# Patient Record
Sex: Female | Born: 1975 | Race: Black or African American | Hispanic: No | Marital: Single | Smoking: Former smoker
Health system: Southern US, Community
[De-identification: ages and names within clinical notes are randomized; demographics above are authoritative.]

## PROBLEM LIST (undated history)

## (undated) ENCOUNTER — Emergency Department (HOSPITAL_COMMUNITY): Admission: EM | Discharge status: Absent without leave | Payer: Medicare PPO

## (undated) DIAGNOSIS — R202 Paresthesia of skin: Secondary | ICD-10-CM

## (undated) DIAGNOSIS — M47816 Spondylosis without myelopathy or radiculopathy, lumbar region: Secondary | ICD-10-CM

## (undated) DIAGNOSIS — F32A Depression, unspecified: Secondary | ICD-10-CM

## (undated) DIAGNOSIS — D649 Anemia, unspecified: Secondary | ICD-10-CM

## (undated) DIAGNOSIS — I10 Essential (primary) hypertension: Secondary | ICD-10-CM

## (undated) DIAGNOSIS — G5603 Carpal tunnel syndrome, bilateral upper limbs: Secondary | ICD-10-CM

## (undated) DIAGNOSIS — M25562 Pain in left knee: Principal | ICD-10-CM

## (undated) HISTORY — PX: BREAST REDUCTION SURGERY: SHX8

## (undated) HISTORY — PX: HAND SURGERY: SHX662

## (undated) HISTORY — PX: TUBAL LIGATION: SHX77

## (undated) HISTORY — DX: Anemia, unspecified: D64.9

## (undated) HISTORY — DX: Depression, unspecified: F32.A

---

## 2002-10-03 HISTORY — PX: TUBAL LIGATION: SHX77

## 2012-10-03 HISTORY — PX: REDUCTION MAMMAPLASTY: SUR839

## 2013-10-03 HISTORY — PX: HAND SURGERY: SHX662

## 2019-09-30 ENCOUNTER — Ambulatory Visit (INDEPENDENT_AMBULATORY_CARE_PROVIDER_SITE_OTHER): Payer: Medicare PPO | Admitting: Family Medicine

## 2019-09-30 ENCOUNTER — Other Ambulatory Visit: Payer: Self-pay

## 2019-09-30 ENCOUNTER — Encounter: Payer: Self-pay | Admitting: Family Medicine

## 2019-09-30 VITALS — BP 128/84 | HR 87 | Ht 64.0 in | Wt 203.0 lb

## 2019-09-30 DIAGNOSIS — Z8659 Personal history of other mental and behavioral disorders: Secondary | ICD-10-CM | POA: Insufficient documentation

## 2019-09-30 DIAGNOSIS — M25511 Pain in right shoulder: Secondary | ICD-10-CM | POA: Diagnosis not present

## 2019-09-30 DIAGNOSIS — Z1231 Encounter for screening mammogram for malignant neoplasm of breast: Secondary | ICD-10-CM | POA: Diagnosis not present

## 2019-09-30 DIAGNOSIS — M5442 Lumbago with sciatica, left side: Secondary | ICD-10-CM | POA: Diagnosis not present

## 2019-09-30 DIAGNOSIS — G8929 Other chronic pain: Secondary | ICD-10-CM

## 2019-09-30 DIAGNOSIS — M25562 Pain in left knee: Secondary | ICD-10-CM | POA: Diagnosis not present

## 2019-09-30 DIAGNOSIS — M549 Dorsalgia, unspecified: Secondary | ICD-10-CM | POA: Insufficient documentation

## 2019-09-30 MED ORDER — TRAMADOL HCL 50 MG PO TABS: 50.0000 mg | ORAL_TABLET | Freq: Four times a day (QID) | ORAL | 0 refills | Status: DC | PRN

## 2019-09-30 MED ORDER — ZOLPIDEM TARTRATE 10 MG PO TABS: 10.0000 mg | ORAL_TABLET | Freq: Every evening | ORAL | 0 refills | Status: DC | PRN

## 2019-09-30 NOTE — Patient Instructions (Addendum)
It was great to see you!  Our plans for today:  - I'm refilling your tramadol and ambien but I would like to discuss alternatives to these the next time I see you in a week or two - I am going to refer you to physical therapy for your shoulder and knee pain - I would like to wait on your medical records before trying to place a referral for the epidural shots - I am ordering a mammogram for you today. - On our next visit we will discuss your depression and possible treatment options.  Take care and seek immediate care sooner if you develop any concerns.   Dr. [facility]

## 2019-09-30 NOTE — Assessment & Plan Note (Signed)
Assessment: Patient with 1 year history of left knee pain she states in the past she has received some injections for this.  Plan: -Referral to physical therapy given to patient -We will await patient's medical records before considering further knee injections -Follow-up with patient schedule in the next 1-2 weeks

## 2019-09-30 NOTE — Assessment & Plan Note (Signed)
Plan: -Referral to physical therapy provided to patient

## 2019-09-30 NOTE — Assessment & Plan Note (Addendum)
Assessment: Patient with low back pain he states she used to get regular epidurals via orthopedics.  Etiology likely musculoskeletal.  Patient without red flags suggestive of infection, malignancy, or fracture. Plan: -Awaiting patient's medical records prior to initiating a referral.  Patient plans to return in the next 1-2 weeks to further discuss this -Provided patient short-term refill of her tramadol but discussed with her that this is not an ideal long-term solution to her problem.  Patient is aware and understands.  PDMP reviewed

## 2019-09-30 NOTE — Assessment & Plan Note (Signed)
Assessment: PHQ-9 score of 13.  No suicidal homicidal ideation.  Patient states she has a history of feeling depressed but does not has any specific item to attribute this to at this time. Plan: -We will discuss this further at the next visit in the next 1-2 weeks -Can consider starting medication such as SSRI at this time once receive more the patient's medical records -We will plan for repeat PHQ-9 on the next visit -Return precautions given to patient
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
## 2019-10-04 DIAGNOSIS — M48062 Spinal stenosis, lumbar region with neurogenic claudication: Secondary | ICD-10-CM

## 2019-10-04 HISTORY — DX: Spinal stenosis, lumbar region with neurogenic claudication: M48.062

## 2019-10-12 ENCOUNTER — Emergency Department (HOSPITAL_COMMUNITY)
Admission: EM | Admit: 2019-10-12 | Discharge: 2019-10-12 | Disposition: A | Discharge status: Home / Self Care | Payer: Medicare PPO | Attending: Emergency Medicine | Admitting: Emergency Medicine

## 2019-10-12 ENCOUNTER — Other Ambulatory Visit: Payer: Self-pay

## 2019-10-12 ENCOUNTER — Encounter (HOSPITAL_COMMUNITY): Payer: Self-pay | Admitting: Emergency Medicine

## 2019-10-12 DIAGNOSIS — J069 Acute upper respiratory infection, unspecified: Secondary | ICD-10-CM

## 2019-10-12 DIAGNOSIS — Z87891 Personal history of nicotine dependence: Secondary | ICD-10-CM | POA: Diagnosis not present

## 2019-10-12 DIAGNOSIS — J029 Acute pharyngitis, unspecified: Secondary | ICD-10-CM | POA: Diagnosis present

## 2019-10-12 DIAGNOSIS — U071 COVID-19: Secondary | ICD-10-CM | POA: Diagnosis not present

## 2019-10-12 HISTORY — DX: COVID-19: U07.1

## 2019-10-12 NOTE — ED Provider Notes (Signed)
[facility] DEPT Provider Note   CSN: [id number] Arrival date & time: 10/12/19  1153     History Chief Complaint  Patient presents with  . Chills  . Sore Throat  . loss of smell    Sydney Cobb is a 44 y.o. female.  44 year old female presents with complaint of sore throat, chills, diarrhea, cough, body aches and loss of smell.  Patient ports onset of symptoms 1 week ago, states she traveled to [place], [place] and was exposed to family members who have also been sick.  Patient denies history of asthma or chronic lung conditions, no other complaints or concerns.  Sydney Cobb was evaluated in Emergency Department on 10/12/2019 for the symptoms described in the history of present illness. She was evaluated in the context of the global COVID-19 pandemic, which necessitated consideration that the patient might be at risk for infection with the SARS-CoV-2 virus that causes COVID-19. Institutional protocols and algorithms that pertain to the evaluation of patients at risk for COVID-19 are in a state of rapid change based on information released by regulatory bodies including the CDC and federal and state organizations. These policies and algorithms were followed during the patient's care in the ED.         No past medical history on file.  Patient Active Problem List   Diagnosis Date Noted  . Chronic right shoulder pain 09/30/2019  . Left knee pain 09/30/2019  . Back pain 09/30/2019  . History of depression 09/30/2019     OB History   No obstetric history on file.     No family history on file.  Social History   Tobacco Use  . Smoking status: Former Research scientist (life sciences)  . Smokeless tobacco: Never Used  Substance Use Topics  . Alcohol use: Never  . Drug use: Never    Home Medications Prior to Admission medications   Medication Sig Start Date End Date Taking? Authorizing Provider  traMADol (ULTRAM) 50 MG tablet Take 1 tablet (50 mg total)  by mouth every 6 (six) hours as needed. 09/30/19   Welborn, Ryan, DO  zolpidem (AMBIEN) 10 MG tablet Take 1 tablet (10 mg total) by mouth at bedtime as needed for up to 15 doses for sleep. 09/30/19   Lurline Del, DO    Allergies    Patient has no known allergies.  Review of Systems   Review of Systems  Constitutional: Positive for chills. Negative for fever.  HENT: Positive for congestion and sore throat.   Respiratory: Positive for cough and shortness of breath.   Gastrointestinal: Positive for diarrhea. Negative for abdominal pain.  Musculoskeletal: Positive for arthralgias and myalgias.  Skin: Negative for rash and wound.  Allergic/Immunologic: Negative for immunocompromised state.  Neurological: Negative for weakness.  Hematological: Negative for adenopathy.  Psychiatric/Behavioral: Negative for confusion.  All other systems reviewed and are negative.   Physical Exam Updated Vital Signs BP (!) 167/98 (BP Location: Right Arm)   Pulse (!) 107   Temp 98.2 F (36.8 C) (Oral)   Resp 18   Ht 5\' 4"  (1.626 m)   Wt 92.5 kg   LMP 09/22/2019 (Approximate)   SpO2 100%   BMI 35.02 kg/m   Physical Exam Vitals and nursing note reviewed.  Constitutional:      General: She is not in acute distress.    Appearance: She is well-developed. She is not diaphoretic.  HENT:     Head: Normocephalic and atraumatic.  Cardiovascular:     Rate and  Rhythm: Normal rate and regular rhythm.     Heart sounds: Normal heart sounds.  Pulmonary:     Effort: Pulmonary effort is normal.     Breath sounds: Normal breath sounds.  Abdominal:     Palpations: Abdomen is soft.  Musculoskeletal:     Cervical back: Neck supple.  Lymphadenopathy:     Cervical: No cervical adenopathy.  Skin:    General: Skin is warm and dry.     Findings: No erythema or rash.  Neurological:     Mental Status: She is alert and oriented to person, place, and time.  Psychiatric:        Behavior: Behavior normal.      ED Results / Procedures / Treatments   Labs (all labs ordered are listed, but only abnormal results are displayed) Labs Reviewed  NOVEL CORONAVIRUS, NAA (HOSP ORDER, SEND-OUT TO REF LAB; TAT 18-24 HRS)    EKG None  Radiology No results found.  Procedures Procedures (including critical care time)  Medications Ordered in ED Medications - No data to display  ED Course  I have reviewed the triage vital signs and the nursing notes.  Pertinent labs & imaging results that were available during my care of the patient were reviewed by me and considered in my medical decision making (see chart for details).  Clinical Course as of Oct 11 1552  Sat Oct 11, 3866  6848 44 year old female presents with symptoms concerning for COVID-19 infection.  Patient is well-appearing, vitals are reassuring.  Plan is to send out test, advised to quarantine and follow-up with her test results.   [LM]    Clinical Course User Index [LM] Roque Lias   MDM Rules/Calculators/A&P                     Final Clinical Impression(s) / ED Diagnoses Final diagnoses:  Viral upper respiratory tract infection    Rx / DC Orders ED Discharge Orders    None       Tacy Learn, PA-C 10/12/19 1554    Blanchie Dessert, MD 10/13/19 1645

## 2019-10-12 NOTE — Discharge Instructions (Addendum)
Home to quarantine, pending area test results. Your results should be available after 24 to 48 hours. Return to ER for severe or concerning symptoms otherwise follow-up with PCP.

## 2019-10-12 NOTE — ED Triage Notes (Signed)
Pt states that she started having sore throat, body aches, chills, loss of smell about 1 week ago. Alert and oriented. 100% RA

## 2019-10-14 ENCOUNTER — Ambulatory Visit: Payer: Medicare PPO | Admitting: Physical Therapy

## 2019-10-14 LAB — NOVEL CORONAVIRUS, NAA (HOSP ORDER, SEND-OUT TO REF LAB; TAT 18-24 HRS): SARS-CoV-2, NAA: DETECTED — AB

## 2019-10-31 ENCOUNTER — Other Ambulatory Visit: Payer: Self-pay | Admitting: Family Medicine

## 2019-11-01 ENCOUNTER — Telehealth: Payer: Self-pay

## 2019-11-01 ENCOUNTER — Other Ambulatory Visit: Payer: Self-pay | Admitting: Family Medicine

## 2019-11-01 DIAGNOSIS — G8929 Other chronic pain: Secondary | ICD-10-CM

## 2019-11-01 DIAGNOSIS — M5442 Lumbago with sciatica, left side: Secondary | ICD-10-CM

## 2019-11-01 MED ORDER — TRAMADOL HCL 50 MG PO TABS: 50.0000 mg | ORAL_TABLET | Freq: Four times a day (QID) | ORAL | 0 refills | Status: DC | PRN

## 2019-11-01 NOTE — Progress Notes (Signed)
Called patient and spoke to her about her refill request for more tramadol as she is trying to get in with an orthopedic doctor to work on her chronic back pain.  The patient had previoously gone to an orthopedist  for injections into her back due to chronic back pain prior to moving to [place], [place].  I informed the patient that I sent in a referral for her to see an orthopedist here in [place] and that would refill her tramadol for an additional month to provide her time to get him an established with an orthopedic physician I informed the patient that after this time may not be providing more refills of this medication.  I informed patient please let me know if she has not heard from an orthopedic physician in the next 2 weeks.  PDMP reviewed.

## 2019-11-01 NOTE — Telephone Encounter (Signed)
Pt calls nurse line in regards to refill request for tramadol.  Patient states that she is still experiencing pain and needs refill.   To PCP   Please advise Talbot Grumbling, RN

## 2019-11-13 ENCOUNTER — Ambulatory Visit: Payer: Medicare PPO | Admitting: Family Medicine

## 2019-11-15 ENCOUNTER — Ambulatory Visit: Payer: Medicare PPO

## 2019-11-22 ENCOUNTER — Ambulatory Visit: Payer: Medicare PPO | Admitting: Family Medicine

## 2019-11-28 ENCOUNTER — Ambulatory Visit: Payer: Medicare PPO | Admitting: Family Medicine

## 2019-12-04 ENCOUNTER — Telehealth (INDEPENDENT_AMBULATORY_CARE_PROVIDER_SITE_OTHER): Payer: Medicare PPO | Admitting: Family Medicine

## 2019-12-04 ENCOUNTER — Other Ambulatory Visit: Payer: Self-pay

## 2019-12-04 DIAGNOSIS — M5442 Lumbago with sciatica, left side: Secondary | ICD-10-CM | POA: Diagnosis not present

## 2019-12-04 DIAGNOSIS — Z1231 Encounter for screening mammogram for malignant neoplasm of breast: Secondary | ICD-10-CM | POA: Diagnosis not present

## 2019-12-04 DIAGNOSIS — Z1239 Encounter for other screening for malignant neoplasm of breast: Secondary | ICD-10-CM | POA: Diagnosis not present

## 2019-12-04 DIAGNOSIS — G8929 Other chronic pain: Secondary | ICD-10-CM

## 2019-12-04 MED ORDER — TRAMADOL HCL 50 MG PO TABS: 50.0000 mg | ORAL_TABLET | Freq: Four times a day (QID) | ORAL | 0 refills | Status: DC | PRN

## 2019-12-04 MED ORDER — ZOLPIDEM TARTRATE 10 MG PO TABS: 5.0000 mg | ORAL_TABLET | Freq: Every evening | ORAL | 1 refills | Status: DC | PRN

## 2019-12-04 NOTE — Assessment & Plan Note (Addendum)
Assessment: Chronic back pain-patient previously asked to get established with orthopedic surgery as she followed with them prior to moving to this area in a different location.  Patient apparently no showed to her orthopedic surgery appointment which she says is due to lack of transportation at that time which is since resolved. Plan: -Provided patient referral for physical therapy, orthopedic surgery. -Informed patient that I would provide her a few more days worth of her tramadol but after this I will provide her no further refills on this medication as she missed her previous physical therapy and orthopedic surgery appointments to get established with them.  Patient verbalized understanding that this would be the last prescription of tramadol I will be writing for her.

## 2019-12-04 NOTE — Progress Notes (Signed)
Virtual Visit via Telephone Note  I connected with Sydney Cobb on 12/04/19 at 10:05 AM EST by telephone and verified that I am speaking with the correct person using two identifiers.  Location: Patient: Home Provider: [facility]   I discussed the limitations, risks, security and privacy concerns of performing an evaluation and management service by telephone and the availability of in person appointments. I also discussed with the patient that there may be a patient responsible charge related to this service. The patient expressed understanding and agreed to proceed.   History of Present Illness: Requesting to switch from an in person appointment to telephone appointment due to oversleeping.  Patient states she scheduled this appointment because she had some transportation issues and because of that she missed her mammogram, her PT appointments, and her orthopedic appointment.  States her chronic back pain continues but she has been unable to get in with her physical therapist or orthopedic surgeon.  She states this transportation issues now resolved.  Per the referral note from the orthopedic appointment the patient did not call and canceled and simply no-show.  She also states that she wanted refills on her tramadol and Ambien.  I informed the patient that as per our last discussion I had stated that and may not refill it again when she gets established with the orthopedic surgeon.     Observations/Objective: Patient without signs of shortness of breath or difficulty speaking/breathing during the conversation via phone.  Assessment and Plan: After further discussion with the patient and reviewing the PDMP I decided it would be appropriate to give the patient a small additional one half tramadol to hold her over until she reestablishes with her orthopedic surgeon.  I informed the patient that this would be the last prescription I would be given for this and the patient is understanding that the goal  is to get established with orthopedic surgeon and continue her back injections that she was using prior to moving rather than using narcotics long-term for back pain. -Refills provided -Referral for mammogram provided -Referral for PT was provided -Patient plans to call her orthopedic surgeon and asked to reschedule, informed patient that if they require another referral I can certainly provide this. -Recommended patient schedule an in person appointment to further discuss her other medical complaints including history of depression. -PDMP reviewed  Follow Up Instructions:  Patient plans to call her orthopedic surgeon and reschedule her appointment.  If this is not possible patient plans to call back and I can provide another referral for that orthopedic surgeon. Patient plans to schedule follow-up appointment to discuss her other medical complaints including history of depression.  I discussed the assessment and treatment plan with the patient. The patient was provided an opportunity to ask questions and all were answered. The patient agreed with the plan and demonstrated an understanding of the instructions.   The patient was advised to call back or seek an in-person evaluation if the symptoms worsen or if the condition fails to improve as anticipated.  I provided 12 minutes of non-face-to-face time during this encounter.   Lurline Del, DO

## 2019-12-10 ENCOUNTER — Ambulatory Visit: Payer: Medicare PPO | Admitting: Family Medicine

## 2019-12-12 ENCOUNTER — Ambulatory Visit
Admission: RE | Admit: 2019-12-12 | Discharge: 2019-12-12 | Disposition: A | Discharge status: Home / Self Care | Payer: Medicare PPO | Source: Ambulatory Visit | Attending: Family Medicine | Admitting: Family Medicine

## 2019-12-12 ENCOUNTER — Other Ambulatory Visit: Payer: Self-pay

## 2019-12-12 DIAGNOSIS — Z1231 Encounter for screening mammogram for malignant neoplasm of breast: Secondary | ICD-10-CM

## 2019-12-17 ENCOUNTER — Ambulatory Visit: Payer: Medicare PPO | Admitting: Family Medicine

## 2019-12-24 ENCOUNTER — Ambulatory Visit: Payer: Medicare PPO

## 2019-12-25 ENCOUNTER — Other Ambulatory Visit: Payer: Self-pay

## 2019-12-25 ENCOUNTER — Ambulatory Visit: Payer: Medicare PPO | Attending: Family Medicine

## 2019-12-25 DIAGNOSIS — M5442 Lumbago with sciatica, left side: Secondary | ICD-10-CM | POA: Insufficient documentation

## 2019-12-25 DIAGNOSIS — M25552 Pain in left hip: Secondary | ICD-10-CM | POA: Diagnosis present

## 2019-12-25 DIAGNOSIS — G8929 Other chronic pain: Secondary | ICD-10-CM | POA: Diagnosis present

## 2019-12-25 DIAGNOSIS — R293 Abnormal posture: Secondary | ICD-10-CM | POA: Diagnosis present

## 2019-12-25 NOTE — Patient Instructions (Signed)
Access Code: [email or web address]: [email or web address]: 03/24/2021Prepared by: CynthiaExercises  Supine Lower Trunk Rotation - 2 x daily - 7 x weekly - 1 sets - 10 reps  Supine Figure 4 Piriformis Stretch - 1 x daily - 7 x weekly - 1 sets - 3 reps - 20 seconds hold  Supine Hamstring Stretch with Strap - 1 x daily - 7 x weekly - 1 sets - 3 reps - 20 seconds hold  Supine ITB Stretch with Strap - 1 x daily - 7 x weekly - 1 sets - 3 reps - 20 seconds hold
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
## 2020-01-01 ENCOUNTER — Telehealth: Payer: Self-pay | Admitting: Family Medicine

## 2020-01-01 NOTE — Telephone Encounter (Signed)
°   Sydney Cobb DOB: June 25, 1976 MRN: [id number]   RIDER WAIVER AND RELEASE OF LIABILITY  For purposes of improving physical access to our facilities, [facility] is pleased to partner with third parties to provide [facility] patients or other authorized individuals the option of convenient, on-demand ground transportation services (the [employer]) through use of the technology service that enables users to request on-demand ground transportation from independent third-party providers.  By opting to use and accept these [employer], I, the undersigned, hereby agree on behalf of myself, and on behalf of any minor child using the [employer] for whom I am the parent or legal guardian, as follows:  1. Government social research officer provided to me are provided by independent third-party transportation providers who are not [employer] or employees and who are unaffiliated with [employer]. 2. [facility] is neither a transportation carrier nor a common or public carrier. 3. [facility] has no control over the quality or safety of the transportation that occurs as a result of the [employer]. 4. [facility] cannot guarantee that any third-party transportation provider will complete any arranged transportation service. 5. [facility] makes no representation, warranty, or guarantee regarding the reliability, timeliness, quality, safety, suitability, or availability of any of the Transport Services or that they will be error free. 6. I fully understand that traveling by vehicle involves risks and dangers of serious bodily injury, including permanent disability, paralysis, and death. I agree, on behalf of myself and on behalf of any minor child using the Transport Services for whom I am the parent or legal guardian, that the entire risk arising out of my use of the [employer] remains solely with me, to the maximum extent permitted under applicable law. 7. The [employer] are provided as is and as available. [facility] disclaims all representations and warranties, express, implied or statutory, not expressly set out in these terms, including the implied warranties of merchantability and fitness for a particular purpose. 8. I hereby waive and release [facility], its agents, employees, officers, directors, representatives, insurers, attorneys, assigns, successors, subsidiaries, and affiliates from any and all past, present, or future claims, demands, liabilities, actions, causes of action, or suits of any kind directly or indirectly arising from acceptance and use of the [employer]. 9. I further waive and release [facility] and its affiliates from all present and future liability and responsibility for any injury or death to persons or damages to property caused by or related to the use of the [employer]. 10. I have read this Waiver and Release of Liability, and I understand the terms used in it and their legal significance. This Waiver is freely and voluntarily given with the understanding that my right (as well as the right of any minor child for whom I am the parent or legal guardian using the [employer]) to legal recourse against  in connection with the [employer] is knowingly surrendered in return for use of these services.   I attest that I read the consent document to Sydney Cobb, gave Sydney Cobb the opportunity to ask questions and answered the questions asked (if any). I affirm that Sydney Cobb then provided consent for she's participation in this program.     Sydney Cobb

## 2020-01-02 ENCOUNTER — Ambulatory Visit: Payer: Medicare PPO

## 2020-01-08 ENCOUNTER — Ambulatory Visit: Payer: Medicare PPO | Admitting: Physical Therapy

## 2020-01-14 ENCOUNTER — Encounter: Payer: Medicare PPO | Admitting: Physical Therapy

## 2020-01-16 ENCOUNTER — Ambulatory Visit: Payer: Medicare PPO | Attending: Family Medicine | Admitting: Physical Therapy

## 2020-01-16 ENCOUNTER — Other Ambulatory Visit: Payer: Self-pay

## 2020-01-16 ENCOUNTER — Encounter: Payer: Self-pay | Admitting: Physical Therapy

## 2020-01-16 DIAGNOSIS — M25552 Pain in left hip: Secondary | ICD-10-CM | POA: Insufficient documentation

## 2020-01-16 DIAGNOSIS — R293 Abnormal posture: Secondary | ICD-10-CM | POA: Insufficient documentation

## 2020-01-16 DIAGNOSIS — M5442 Lumbago with sciatica, left side: Secondary | ICD-10-CM | POA: Diagnosis present

## 2020-01-16 DIAGNOSIS — G8929 Other chronic pain: Secondary | ICD-10-CM | POA: Diagnosis present

## 2020-01-16 NOTE — Therapy (Addendum)
[place], [place], [address] Phone: [phone]   Fax:  [phone]  Physical Therapy Treatment/Discharge  Patient Details  Name: Terasa Orsini MRN: [id number] Date of Birth: [birth year] Referring Provider (PT): Dorris Singh, MD   Encounter Date: 01/16/2020  PT End of Session - 01/16/20 1235    Visit Number  2    Number of Visits  13    Date for PT Re-Evaluation  02/14/20    Authorization Type  [employer] MCR/MCD    PT Start Time  1230    PT Stop Time  8270    PT Time Calculation (min)  38 min       History reviewed. No pertinent past medical history.  Past Surgical History:  Procedure Laterality Date  . BREAST REDUCTION SURGERY    . HAND SURGERY Right   . REDUCTION MAMMAPLASTY Bilateral 2014  . TUBAL LIGATION      There were no vitals filed for this visit.  Subjective Assessment - 01/16/20 1234    Subjective  I joined a gym and have been working out there.    Currently in Pain?  No/denies                       [facility] Adult PT Treatment/Exercise - 01/16/20 0001      Self-Care   Self-Care  Other Self-Care Comments    Other Self-Care Comments   Sleep postures, avoid sit-ups, stretch in AM       Lumbar Exercises: Stretches   Passive Hamstring Stretch  2 reps;20 seconds    Passive Hamstring Stretch Limitations  opposite leg bent supine with strap     Lower Trunk Rotation  10 seconds    Lower Trunk Rotation Limitations  10 reps    ITB Stretch  Left;2 reps;20 seconds    ITB Stretch Limitations  with strap supine pulling leg across body    Piriformis Stretch  2 reps;20 seconds      Lumbar Exercises: Supine   Dead Bug  20 reps    Dead Bug Limitations  with Leg extensions              PT Education - 01/16/20 1303    Education Details  HEP    Person(s) Educated  Patient    Methods  Explanation;Handout    Comprehension  Verbalized understanding          PT Long  Term Goals - 12/25/19 1042      PT LONG TERM GOAL #1   Title  Patient to be independent with HEP for flexiblity, core strength and conditioning.    Baseline  No current HEP    Time  6    Period  Weeks    Status  New    Target Date  02/14/20      PT LONG TERM GOAL #2   Title  Patient to report ability to walk for 25 minutes prior to having to sit.    Baseline  walks 10-15 minutes    Time  6    Period  Weeks    Status  New    Target Date  02/14/20      PT LONG TERM GOAL #3   Title  Patient to report pain in L hip improved by 50%    Baseline  Pain up to 7/10    Time  6    Period  Weeks    Status  New  Target Date  02/14/20      PT LONG TERM GOAL #4   Title  Patient to report no greater than 35% impairment on FOTO.    Baseline  43% impairment    Time  6    Status  New    Target Date  02/14/20            Plan - 01/16/20 1248    Clinical Impression Statement  Pt arrives after absence for 3 weeks due to new job and her schedule. She has been doing her HEP stretching, walking on TM at gym and using the squat rack. Pt is walking on the treadmill at the gym for 10 minutes before she has to stop. She does a total of 2 miles. She is wearing a waist trainer today and states it helps her pain. SHe has pain every morning and reports that her legs drag until she gets moving. Reviewed sleeping posture/alignement and asked her to stretch before getting out of bed. She has beed doing sit ups and causing back pain. Began abdominal stabilization and updated HEP. No increased pain.    PT Next Visit Plan  Review stretches, review and progress core strength, try ionto on L hip with signed plan    PT Home Exercise Plan  figure 4 piriformis stretch, supine hamstring stretch with strap, supine IT band stretch with strap, LTR, added dead bug with knee extension       Patient will benefit from skilled therapeutic intervention in order to improve the following deficits and impairments:  Decreased  activity tolerance, Decreased endurance, Decreased mobility, Difficulty walking, Increased fascial restricitons, Increased muscle spasms, Impaired perceived functional ability, Improper body mechanics, Postural dysfunction, Pain  Visit Diagnosis: Abnormal posture  Chronic bilateral low back pain with left-sided sciatica  Pain in left hip     Problem List Patient Active Problem List   Diagnosis Date Noted  . Chronic right shoulder pain 09/30/2019  . Left knee pain 09/30/2019  . Back pain 09/30/2019  . History of depression 09/30/2019    Dorene Ar , PTA [facility] [address] [place], [place], [address] Phone: [phone]   Fax:  [phone]  Name: Jessilyn Catino MRN: [id number] Date of Birth: September 13, 1976  PHYSICAL THERAPY DISCHARGE SUMMARY  Visits from Start of Care: 2  Current functional level related to goals / functional outcomes: See above   Remaining deficits: See above   Education / Equipment: Anatomy of condition, POC, HEP, exercise form/rationale  Plan: Patient agrees to discharge.  Patient goals were not met. Patient is being discharged due to not returning since the last visit.  ?????     Adrian Specht C. Hightower PT, DPT 04/08/20 10:08 AM

## 2020-01-16 NOTE — Patient Instructions (Signed)
Access Code: [email or web address] URL: [email or web address] Date: 01/16/2020 Prepared by: Hessie Diener  Exercises Supine Dead Bug with Leg Extension - 2 x daily - 7 x weekly - 2 sets - 10 reps

## 2020-01-21 ENCOUNTER — Ambulatory Visit: Payer: Medicare PPO | Admitting: Physical Therapy

## 2020-01-23 ENCOUNTER — Ambulatory Visit: Payer: Medicare PPO | Admitting: Physical Therapy

## 2020-01-30 ENCOUNTER — Ambulatory Visit: Payer: Medicare PPO | Admitting: Physical Therapy

## 2020-03-13 ENCOUNTER — Encounter (HOSPITAL_COMMUNITY): Payer: Self-pay

## 2020-03-13 ENCOUNTER — Emergency Department (HOSPITAL_COMMUNITY): Payer: Medicare PPO

## 2020-03-13 ENCOUNTER — Other Ambulatory Visit: Payer: Self-pay

## 2020-03-13 ENCOUNTER — Emergency Department (HOSPITAL_COMMUNITY)
Admission: EM | Admit: 2020-03-13 | Discharge: 2020-03-13 | Disposition: A | Discharge status: Home / Self Care | Payer: Medicare PPO | Attending: Emergency Medicine | Admitting: Emergency Medicine

## 2020-03-13 DIAGNOSIS — R519 Headache, unspecified: Secondary | ICD-10-CM | POA: Diagnosis present

## 2020-03-13 DIAGNOSIS — G43909 Migraine, unspecified, not intractable, without status migrainosus: Secondary | ICD-10-CM | POA: Diagnosis not present

## 2020-03-13 DIAGNOSIS — D649 Anemia, unspecified: Secondary | ICD-10-CM

## 2020-03-13 DIAGNOSIS — R0602 Shortness of breath: Secondary | ICD-10-CM | POA: Diagnosis not present

## 2020-03-13 DIAGNOSIS — Z87891 Personal history of nicotine dependence: Secondary | ICD-10-CM | POA: Insufficient documentation

## 2020-03-13 DIAGNOSIS — E876 Hypokalemia: Secondary | ICD-10-CM | POA: Diagnosis not present

## 2020-03-13 DIAGNOSIS — R42 Dizziness and giddiness: Secondary | ICD-10-CM | POA: Insufficient documentation

## 2020-03-13 HISTORY — DX: Migraine, unspecified, not intractable, without status migrainosus: G43.909

## 2020-03-13 LAB — COMPREHENSIVE METABOLIC PANEL
ALT: 23 U/L (ref 0–44)
AST: 21 U/L (ref 15–41)
Albumin: 3.9 g/dL (ref 3.5–5.0)
Alkaline Phosphatase: 59 U/L (ref 38–126)
Anion gap: 11 (ref 5–15)
BUN: 7 mg/dL (ref 6–20)
CO2: 21 mmol/L — ABNORMAL LOW (ref 22–32)
Calcium: 9.1 mg/dL (ref 8.9–10.3)
Chloride: 106 mmol/L (ref 98–111)
Creatinine, Ser: 0.39 mg/dL — ABNORMAL LOW (ref 0.44–1.00)
GFR calc Af Amer: >60 mL/min (ref >60)
GFR calc non Af Amer: >60 mL/min (ref >60)
Glucose, Bld: 139 mg/dL — ABNORMAL HIGH (ref 70–99)
Potassium: 3.2 mmol/L — ABNORMAL LOW (ref 3.5–5.1)
Sodium: 138 mmol/L (ref 135–145)
Total Bilirubin: 0.6 mg/dL (ref 0.3–1.2)
Total Protein: 7.2 g/dL (ref 6.5–8.1)

## 2020-03-13 LAB — CBC
HCT: 29.6 % — ABNORMAL LOW (ref 36.0–46.0)
Hemoglobin: 8.9 g/dL — ABNORMAL LOW (ref 12.0–15.0)
MCH: 23.7 pg — ABNORMAL LOW (ref 26.0–34.0)
MCHC: 30.1 g/dL (ref 30.0–36.0)
MCV: 78.7 fL — ABNORMAL LOW (ref 80.0–100.0)
Platelets: 347 10*3/uL (ref 150–400)
RBC: 3.76 MIL/uL — ABNORMAL LOW (ref 3.87–5.11)
RDW: 17.2 % — ABNORMAL HIGH (ref 11.5–15.5)
WBC: 5.9 10*3/uL (ref 4.0–10.5)
nRBC: 0 % (ref 0.0–0.2)

## 2020-03-13 MED ORDER — KETOROLAC TROMETHAMINE 60 MG/2ML IM SOLN
60.0000 mg | Freq: Once | INTRAMUSCULAR | Status: AC
  Administered 2020-03-13: 60 mg via INTRAMUSCULAR
  Filled 2020-03-13: qty 2

## 2020-03-13 MED ORDER — POTASSIUM CHLORIDE ER 10 MEQ PO TBCR
10.0000 meq | EXTENDED_RELEASE_TABLET | Freq: Every day | ORAL | 0 refills | Status: DC
Start: 2020-03-13 — End: 2020-10-13

## 2020-03-13 MED ORDER — PROCHLORPERAZINE MALEATE 10 MG PO TABS
10.0000 mg | ORAL_TABLET | Freq: Once | ORAL | Status: AC
  Administered 2020-03-13: 10 mg via ORAL
  Filled 2020-03-13: qty 1

## 2020-03-13 MED ORDER — DIPHENHYDRAMINE HCL 25 MG PO CAPS
50.0000 mg | ORAL_CAPSULE | Freq: Once | ORAL | Status: AC
  Administered 2020-03-13: 50 mg via ORAL
  Filled 2020-03-13: qty 2

## 2020-03-13 MED ORDER — DIPHENHYDRAMINE HCL 50 MG/ML IJ SOLN: 25.0000 mg | Freq: Once | INTRAMUSCULAR | Status: DC

## 2020-03-13 MED ORDER — PROCHLORPERAZINE EDISYLATE 10 MG/2ML IJ SOLN: 10.0000 mg | Freq: Once | INTRAMUSCULAR | Status: DC

## 2020-03-13 NOTE — ED Notes (Signed)
Pt transported to CT ?

## 2020-03-13 NOTE — Discharge Instructions (Signed)
Your work-up today was overall reassuring. Your scan did not show any bleeds or tumors that could be causing your headache. Your symptoms are likely due to a migraine. We also found that your hemoglobin was fairly low today. This might be contributing to your shortness of breath. Your potassium was also low, it is important to eat high potassium rich foods, for which I provided handout. I will also give you potassium supplements. Please follow-up with your primary care doctor within the next week or 2 for your lab work and headaches.

## 2020-03-13 NOTE — ED Triage Notes (Signed)
Pt reports a frontal headache that started yesterday. She endorses sweating and nausea. She denies chills or vision changes. Denies sick contacts. A&Ox4.

## 2020-03-13 NOTE — ED Provider Notes (Addendum)
[facility] DEPT Provider Note   CSN: [id number] Arrival date & time: 03/13/20  2013     History Chief Complaint  Patient presents with  . Headache    Sydney Cobb is a 44 y.o. female.  HPI 44 year old female with no significant medical history presents to the ER for 2-day history of headache.  She states that she started to have a headache 2 days ago which was gradual in onset.  She describes it as dull and throbbing.  She also endorses dizziness.  At times feels as though she "is cross eyed".  She rates the pain a 9/10.  She does not have a history of headaches.  States that the headache has been unrelieved by Tylenol.  She says at times she also feels nauseous, but is not vomiting.  She states that she also has some lower abdominal cramping at times.  No discharge, bleeding, odors.  She states that she does have shortness of breath but this is chronic and unchanged from her baseline.  She states that she is worried as this headache is very on characteristic for her which brought her to the ER.  She is not on any blood thinners.  No recent falls or injuries to the head.  No fevers, chills, dysuria, chest pain, back pain, neck stiffness, neck pain.    History reviewed. No pertinent past medical history.  Patient Active Problem List   Diagnosis Date Noted  . Chronic right shoulder pain 09/30/2019  . Left knee pain 09/30/2019  . Back pain 09/30/2019  . History of depression 09/30/2019    Past Surgical History:  Procedure Laterality Date  . BREAST REDUCTION SURGERY    . HAND SURGERY Right   . REDUCTION MAMMAPLASTY Bilateral 2014  . TUBAL LIGATION       OB History   No obstetric history on file.     Family History  Problem Relation Age of Onset  . Breast cancer Mother     Social History   Tobacco Use  . Smoking status: Former Research scientist (life sciences)  . Smokeless tobacco: Never Used  Substance Use Topics  . Alcohol use: Never  . Drug use: Never     Home Medications Prior to Admission medications   Medication Sig Start Date End Date Taking? Authorizing Provider  acetaminophen (TYLENOL) 500 MG tablet Take 500 mg by mouth every 6 (six) hours as needed for moderate pain or headache.   Yes [provider]  naproxen sodium (ALEVE) 220 MG tablet Take 220 mg by mouth 2 (two) times daily as needed (pain).   Yes [provider]  potassium chloride (KLOR-CON) 10 MEQ tablet Take 1 tablet (10 mEq total) by mouth daily. 03/13/20   Garald Balding, PA-C  traMADol (ULTRAM) 50 MG tablet Take 1 tablet (50 mg total) by mouth every 6 (six) hours as needed. Patient not taking: Reported on 03/13/2020 12/04/19   Lurline Del, DO  zolpidem (AMBIEN) 10 MG tablet Take 0.5 tablets (5 mg total) by mouth at bedtime as needed for sleep. Patient not taking: Reported on 03/13/2020 12/04/19   Lurline Del, DO    Allergies    Patient has no known allergies.  Review of Systems   Review of Systems  Constitutional: Negative for chills, fatigue and fever.  HENT: Negative for ear pain and sore throat.   Eyes: Positive for visual disturbance. Negative for photophobia and pain.  Respiratory: Positive for shortness of breath (Chronic, unchanged from baseline). Negative for cough and  chest tightness.   Cardiovascular: Negative for chest pain and palpitations.  Gastrointestinal: Negative for abdominal pain and vomiting.  Genitourinary: Negative for dysuria and hematuria.  Musculoskeletal: Negative for arthralgias and back pain.  Skin: Negative for color change and rash.  Neurological: Positive for dizziness. Negative for seizures and syncope.  Psychiatric/Behavioral: Negative for confusion.  All other systems reviewed and are negative.   Physical Exam Updated Vital Signs BP (!) 144/86   Pulse 73   Temp 99 F (37.2 C)   Resp 19   SpO2 99%   Physical Exam Constitutional:      General: She is not in acute distress.    Appearance: She is  well-developed. She is not ill-appearing or toxic-appearing.  HENT:     Head: Normocephalic and atraumatic.     Mouth/Throat:     Mouth: Mucous membranes are moist.  Eyes:     Extraocular Movements: Extraocular movements intact.     Right eye: Normal extraocular motion.     Pupils: Pupils are equal, round, and reactive to light.     Right eye: Pupil is reactive.     Left eye: Pupil is reactive.  Cardiovascular:     Rate and Rhythm: Normal rate and regular rhythm.     Heart sounds: Normal heart sounds.  Pulmonary:     Effort: Pulmonary effort is normal. No respiratory distress.  Abdominal:     Palpations: Abdomen is soft.     Tenderness: There is no abdominal tenderness (Abdomen soft and nontender globally).  Musculoskeletal:        General: Normal range of motion.     Cervical back: Normal range of motion and neck supple.  Skin:    General: Skin is warm and dry.     Capillary Refill: Capillary refill takes less than 2 seconds.  Neurological:     Mental Status: She is alert and oriented to person, place, and time.     GCS: GCS eye subscore is 4. GCS verbal subscore is 5.     Cranial Nerves: No cranial nerve deficit.     Sensory: No sensory deficit.     Motor: No weakness.     Deep Tendon Reflexes: Reflexes normal.     Comments: Mental Status:  Alert, thought content appropriate, able to give a coherent history. Speech fluent without evidence of aphasia. Able to follow 2 step commands without difficulty.  Cranial Nerves:  II: Peripheral visual fields grossly normal, pupils equal, round, reactive to light III,IV, VI: ptosis not present, extra-ocular motions intact bilaterally  V,VII: smile symmetric, facial light touch sensation equal VIII: hearing grossly normal to voice  X: uvula elevates symmetrically  XI: bilateral shoulder shrug symmetric and strong XII: midline tongue extension without fassiculations Motor:  Normal tone. 5/5 strength of BUE and BLE major muscle groups  including strong and equal grip strength and dorsiflexion/plantar flexion Sensory: light touch normal in all extremities. Cerebellar: normal finger-to-nose with bilateral upper extremities, Romberg sign absent Gait: normal gait and balance. Able to walk on toes and heels with ease.  Negative [place]ins[place]is  Psychiatric:        Mood and Affect: Mood normal.        Behavior: Behavior normal.     ED Results / Procedures / Treatments   Labs (all labs ordered are listed, but only abnormal results are displayed) Labs Reviewed  CBC - Abnormal; Notable for the following components:      Result Value   RBC 3.76 (*)  Hemoglobin 8.9 (*)    HCT 29.6 (*)    MCV 78.7 (*)    MCH 23.7 (*)    RDW 17.2 (*)    All other components within normal limits  COMPREHENSIVE METABOLIC PANEL - Abnormal; Notable for the following components:   Potassium 3.2 (*)    CO2 21 (*)    Glucose, Bld 139 (*)    Creatinine, Ser 0.39 (*)    All other components within normal limits    EKG EKG Interpretation  Date/Time:  Friday March 13 2020 21:34:07 EDT Ventricular Rate:  75 PR Interval:    QRS Duration: 108 QT Interval:  390 QTC Calculation: 436 R Axis:   4 Text Interpretation: Sinus rhythm Non-specific ST-t changes No previous tracing Confirmed by Lajean Saver [id number]) on 03/13/2020 11:20:04 PM   Radiology CT Head Wo Contrast  Result Date: 03/13/2020 CLINICAL DATA:  Frontal headache EXAM: CT HEAD WITHOUT CONTRAST TECHNIQUE: Contiguous axial images were obtained from the base of the skull through the vertex without intravenous contrast. COMPARISON:  None. FINDINGS: Brain: No acute intracranial abnormality. Specifically, no hemorrhage, hydrocephalus, mass lesion, acute infarction, or significant intracranial injury. Vascular: No hyperdense vessel or unexpected calcification. Skull: No acute calvarial abnormality. Sinuses/Orbits: Visualized paranasal sinuses and mastoids clear. Orbital soft tissues unremarkable.  Other: None IMPRESSION: Normal study. Electronically Signed   By: Rolm Baptise M.D.   On: 03/13/2020 22:09   DG Chest Portable 1 View  Result Date: 03/13/2020 CLINICAL DATA:  Chest pain.  Shortness of breath. EXAM: PORTABLE CHEST 1 VIEW COMPARISON:  None. FINDINGS: The heart size and mediastinal contours are within normal limits. Both lungs are clear. The visualized skeletal structures are unremarkable. IMPRESSION: No active disease. Electronically Signed   By: Constance Holster M.D.   On: 03/13/2020 21:32    Procedures Procedures (including critical care time)  Medications Ordered in ED Medications  ketorolac (TORADOL) injection 60 mg (60 mg Intramuscular Given 03/13/20 2139)  prochlorperazine (COMPAZINE) tablet 10 mg (10 mg Oral Given 03/13/20 2253)  diphenhydrAMINE (BENADRYL) capsule 50 mg (50 mg Oral Given 03/13/20 2253)    ED Course  I have reviewed the triage vital signs and the nursing notes.  Pertinent labs & imaging results that were available during my care of the patient were reviewed by me and considered in my medical decision making (see chart for details).    MDM Rules/Calculators/A&P                         44 year old female with gradual onset of headache 2 days ago. On presentation to the ER, the patient is alert and oriented, nontoxic-appearing, no acute distress, sitting comfortably in the ER chair.  She is slightly hypertensive with a blood pressure 154/97, but other vitals are reassuring.  Temp 99 on arrival. No focal neuro deficits, and she is overall well appearing. However, given patient's complaints of double vision and nausea, will order CT to rule out intracranial pathology.  CBC without leukocytosis, but her hemoglobin is noted to be 8.9 today.  No previous labs to compare this to.  She denies any vaginal or rectal bleeding currently, though she does states she has a history of heavy periods.  I discussed this with the patient, and encouraged her to follow-up with  her PCP in order to continue monitoring her hemoglobin levels. I do not think there is an indication for transfusion at this time. I also discussed her low potassium of 3.2  today, will prescribe potassium supplements and encouraged her to eat high potassium foods. EKG without changes. I explained that low potassium can cause life-threatening arrhythmias. Chest x-ray without acute abnormality, EKG normal sinus rhythm.   10:30 PM: On my reevaluation, patient notes no improvement in her headache after Toradol.  Will try migraine cocktail.  Patient will have a family member come pick her up from the ED. CT without acute intracranial abnormality. No signs of hypertensive urgency/emergency. Doubt meningitis, dissection.  11:18 PM: On my reevaluation, patient notes significant improvement in her headache. I prescribed her potassium supplements. Encouraged her to follow-up with her PCP for possible migraine treatment as well. Patient requesting note for work. Discussed return precautions. Patient's questions have been answered to her satisfaction, she voices understanding and is agreeable to this plan. At this stage in the ED course, the patient has been medically screened and is stable for discharge.   Patient was seen and evaluated by Dr. Ashok Cordia and he is agreeable to the above plan and disposition.  Final Clinical Impression(s) / ED Diagnoses Final diagnoses:  Migraine without status migrainosus, not intractable, unspecified migraine type  Hypokalemia  Anemia, unspecified type    Rx / DC Orders ED Discharge Orders         Ordered    potassium chloride (KLOR-CON) 10 MEQ tablet  Daily     Discontinue  Reprint     03/13/20 2223              Garald Balding, PA-C 03/13/20 2325    Lajean Saver, MD 03/15/20 [phone]

## 2020-05-28 ENCOUNTER — Ambulatory Visit (INDEPENDENT_AMBULATORY_CARE_PROVIDER_SITE_OTHER): Payer: Medicare PPO | Admitting: Family Medicine

## 2020-05-28 ENCOUNTER — Other Ambulatory Visit: Payer: Self-pay

## 2020-05-28 VITALS — BP 142/80 | HR 91 | Ht 64.0 in | Wt 212.0 lb

## 2020-05-28 DIAGNOSIS — M5442 Lumbago with sciatica, left side: Secondary | ICD-10-CM | POA: Diagnosis not present

## 2020-05-28 DIAGNOSIS — Z791 Long term (current) use of non-steroidal anti-inflammatories (NSAID): Secondary | ICD-10-CM | POA: Diagnosis not present

## 2020-05-28 DIAGNOSIS — D509 Iron deficiency anemia, unspecified: Secondary | ICD-10-CM

## 2020-05-28 DIAGNOSIS — G8929 Other chronic pain: Secondary | ICD-10-CM

## 2020-05-28 MED ORDER — GABAPENTIN 300 MG PO CAPS: 300.0000 mg | ORAL_CAPSULE | Freq: Three times a day (TID) | ORAL | 3 refills | Status: DC

## 2020-05-28 MED ORDER — PANTOPRAZOLE SODIUM 40 MG PO TBEC: 40.0000 mg | DELAYED_RELEASE_TABLET | Freq: Every day | ORAL | 0 refills | Status: DC

## 2020-05-28 NOTE — Assessment & Plan Note (Signed)
Microcytic anemia noted in June.  Could be secondary to chronic ibuprofen use.  Will repeat CBC today and attach anemia panel.  It is possible that patient needs ferrous sulfate.

## 2020-05-28 NOTE — Patient Instructions (Signed)
Thank you for coming to see me today. It was a pleasure. Today we talked about:   We have gotten you scheduled for an MRI.  Take gabapentin 300 mg 3 times daily for your pain.  This can make you sleepy.  If it makes you too sleepy, please let me know.  You should also take Tylenol 1000 mg every 6 hours for pain.  After you get your MRI, we will see if you need referred to a spine surgeon urgently.  If you have changes in bowel or bladder habits or worsening of numbness and tingling in your groin area, please come in right away.  Do not use ibuprofen anymore.  Given that you have been using this for so long, we will start you on a medicine to help protect your stomach called Protonix.  Take this every day for a month.  We will get some labs today.  If they are abnormal or we need to do something about them, I will call you.  If they are normal, I will send you a message on MyChart (if it is active) or a letter in the mail.  If you don't hear from [place] in 2 weeks, please call the office at the number below.   Please follow-up with PCP in 2-4 weeks.  If you have any questions or concerns, please do not hesitate to call the office at [phone].  Best,   [place] Constable, DO
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
## 2020-05-28 NOTE — Assessment & Plan Note (Addendum)
Given long-term use of NSAIDs, will recheck BMP today for creatinine and CBC.  We will also place patient on PPI for 1 month for gut protection.  Patient advised to avoid NSAID use at this time.  Could also consider referral to GI for colonoscopy given age.

## 2020-05-28 NOTE — Assessment & Plan Note (Addendum)
Chronic in nature but has been worsening over the last few weeks.  She has been taking NSAIDs chronically without improvement.  Discussed case with Dr. Andria Frames given patient's red flag symptoms of possible saddle anesthesia yesterday and possible urinary retention and possibility of cauda equina.  He agrees with MRI in the next few days.  Will wait on spine surgery referral following MRI.  For pain, will start with gabapentin 300 mg 3 times daily, patient advised that this could make her sleepy.  Also advised taking extra strength Tylenol 1000 mg every 6 hours for her pain.  Continue to rest as able.  We will have her follow-up in 2 to 4 weeks with myself or her PCP to ensure continued treatment of pain.  Patient advised to return to care immediately if she has worsening of numbness and tingling in her groin or is unable to pee or has changes in bowel habits.  She voiced understanding.

## 2020-05-29 LAB — CBC
Hemoglobin: 10.6 g/dL — ABNORMAL LOW (ref 11.1–15.9)
MCH: 25.9 pg — ABNORMAL LOW (ref 26.6–33.0)
MCHC: 32 g/dL (ref 31.5–35.7)
MCV: 81 fL (ref 79–97)
Platelets: 296 10*3/uL (ref 150–450)
RBC: 4.09 x10E6/uL (ref 3.77–5.28)
RDW: 19.4 % — ABNORMAL HIGH (ref 11.7–15.4)
WBC: 5.1 10*3/uL (ref 3.4–10.8)

## 2020-05-29 LAB — BASIC METABOLIC PANEL
BUN/Creatinine Ratio: 12 (ref 9–23)
BUN: 6 mg/dL (ref 6–24)
CO2: 22 mmol/L (ref 20–29)
Calcium: 9.5 mg/dL (ref 8.7–10.2)
Chloride: 105 mmol/L (ref 96–106)
Creatinine, Ser: 0.49 mg/dL — ABNORMAL LOW (ref 0.57–1.00)
GFR calc Af Amer: 136 mL/min/{1.73_m2} (ref >59)
GFR calc non Af Amer: 118 mL/min/{1.73_m2} (ref >59)
Glucose: 85 mg/dL (ref 65–99)
Potassium: 4.3 mmol/L (ref 3.5–5.2)
Sodium: 139 mmol/L (ref 134–144)

## 2020-05-29 LAB — ANEMIA PANEL
Ferritin: 20 ng/mL (ref 15–150)
Folate, Hemolysate: 476 ng/mL
Folate, RBC: 1438 ng/mL (ref >498)
Hematocrit: 33.1 % — ABNORMAL LOW (ref 34.0–46.6)
Iron Saturation: 13 % — ABNORMAL LOW (ref 15–55)
Iron: 48 ug/dL (ref 27–159)
Retic Ct Pct: 2 % (ref 0.6–2.6)
Total Iron Binding Capacity: 371 ug/dL (ref 250–450)
UIBC: 323 ug/dL (ref 131–425)
Vitamin B-12: 401 pg/mL (ref 232–1245)

## 2020-05-31 ENCOUNTER — Ambulatory Visit (HOSPITAL_COMMUNITY): Payer: Medicare PPO

## 2020-06-06 ENCOUNTER — Other Ambulatory Visit: Payer: Self-pay

## 2020-06-06 ENCOUNTER — Ambulatory Visit (HOSPITAL_COMMUNITY)
Admission: RE | Admit: 2020-06-06 | Discharge: 2020-06-06 | Disposition: A | Discharge status: Home / Self Care | Payer: Medicare PPO | Source: Ambulatory Visit | Attending: Family Medicine | Admitting: Family Medicine

## 2020-06-06 DIAGNOSIS — G8929 Other chronic pain: Secondary | ICD-10-CM | POA: Insufficient documentation

## 2020-06-06 DIAGNOSIS — M5442 Lumbago with sciatica, left side: Secondary | ICD-10-CM | POA: Insufficient documentation

## 2020-06-09 ENCOUNTER — Other Ambulatory Visit: Payer: Self-pay | Admitting: Family Medicine

## 2020-06-09 DIAGNOSIS — M5416 Radiculopathy, lumbar region: Secondary | ICD-10-CM

## 2020-06-09 MED ORDER — TRAMADOL HCL 50 MG PO TABS: 50.0000 mg | ORAL_TABLET | Freq: Four times a day (QID) | ORAL | 0 refills | Status: DC | PRN

## 2020-06-09 NOTE — Progress Notes (Signed)
Pain medication sent to get patient through to neurosurgery referral.  PMP reviewed, no red flags.

## 2020-06-10 ENCOUNTER — Telehealth: Payer: Self-pay

## 2020-06-10 NOTE — Telephone Encounter (Signed)
Patient calls nurse line stating picked up Tramadol, however thought she was getting Percocet. Patient reports she thought she discussed this with provider, "Tramadol does not work for me." Patient reports she has already taken 6 today due to pain. Will forward to provider who saw patient.

## 2020-06-11 ENCOUNTER — Other Ambulatory Visit: Payer: Self-pay | Admitting: Family Medicine

## 2020-06-11 DIAGNOSIS — M5416 Radiculopathy, lumbar region: Secondary | ICD-10-CM

## 2020-06-11 MED ORDER — LIDOCAINE 5 % EX PTCH: 1.0000 | MEDICATED_PATCH | CUTANEOUS | 0 refills | Status: DC

## 2020-06-11 NOTE — Telephone Encounter (Signed)
Precepted with Dr. McDiarmid.  Called patient.  Discussed that given referral for spine surgery, would not be able to prescribe percocet.  She was understandable.  Rx sent for lidocaine patch.  If she cannot tolerate pain, can consider pred 30mg  x5 days.

## 2020-06-16 ENCOUNTER — Telehealth: Payer: Self-pay

## 2020-06-16 NOTE — Telephone Encounter (Signed)
Received fax from pharmacy, PA needed on Lidocaine Patches. Clinical questions submitted via Cover My Meds. Waiting on response, could take up to 72 hours.  Cover My Meds info: Key: [id number]

## 2020-06-22 NOTE — Telephone Encounter (Signed)
Med approved through 10/02/2020. The pharmacy has been updated.

## 2020-06-30 ENCOUNTER — Ambulatory Visit (INDEPENDENT_AMBULATORY_CARE_PROVIDER_SITE_OTHER): Payer: Medicare PPO | Admitting: Family Medicine

## 2020-06-30 ENCOUNTER — Other Ambulatory Visit: Payer: Self-pay

## 2020-06-30 VITALS — BP 132/68 | HR 85 | Ht 64.0 in | Wt 212.1 lb

## 2020-06-30 DIAGNOSIS — Z23 Encounter for immunization: Secondary | ICD-10-CM | POA: Diagnosis not present

## 2020-06-30 DIAGNOSIS — M5442 Lumbago with sciatica, left side: Secondary | ICD-10-CM | POA: Diagnosis not present

## 2020-06-30 DIAGNOSIS — G8929 Other chronic pain: Secondary | ICD-10-CM

## 2020-06-30 NOTE — Patient Instructions (Signed)
It was great to see you!  Our plans for today:  -I'm glad that you got your neurosurgery appointment for tomorrow.  I think this is where we will get any answers about possible procedures or other options for treatment of your chronic back pain.  I do want you to keep this appointment tomorrow. -As discussed today we will hold off on any changes until we hear what the recommendations are from your appointment tomorrow.  After this appointment if you have any other concerns or any way we can help you please not hesitate to follow-up with [place].   Take care and seek immediate care sooner if you develop any concerns.   Dr. [facility]

## 2020-06-30 NOTE — Assessment & Plan Note (Signed)
Assessment: 44 year old female with a history of chronic low back pain with recent MRI showing moderate impingement at L4-L5 and mild impingement L3/L4 and L5/S1.  Patient has been waiting to get in with neurosurgery and states that she just heard from them with an appointment scheduled for tomorrow.  Patient kept her appointment today with [place] but plans to follow-up with neurosurgery tomorrow to see their recommendations.  No red flag symptoms. Plan: -Patient to see neurosurgery with scheduled appointment tomorrow -We will not make any changes at this time until we see neurosurgery's recommendations -Patient plans to follow-up with [place] after her neurosurgery appointment as needed, according to the recommendations.

## 2020-06-30 NOTE — Progress Notes (Signed)
    SUBJECTIVE:   CHIEF COMPLAINT / HPI:   Back pain: Patient is a 44 year old female that presents today for follow-up to discuss her back pain.  During previous encounter she was prescribed lidocaine patches as she awaits getting in with spine surgery. 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  She states that since this appointment she continues to have back pain with some good and some bad days.  States it is around a 4 out of 10-5 out of 10 today.  Denies saddle numbness, no bowel or bladder incontinence. Does sometimes have pain shooting down left leg. States she just heard from neurosurgery earlier and has an appointment tomorrow.   PERTINENT  PMH / PSH: History of chronic back pain  OBJECTIVE:   BP 132/68   Pulse 85   Ht 5\' 4"  (1.626 m)   Wt 212 lb 2 oz (96.2 kg)   LMP 06/14/2020   SpO2 98%   BMI 36.41 kg/m    General: NAD, pleasant, able to participate in exam Respiratory: No respiratory distress MSK: Discomfort to palpation in the region just lateral to approximately L4-L5 and the musculature on the left side.  Patient with negative straight leg raise on the right but does get a shooting pain down to the knee on the left.  No step-offs palpated in midline. Neuro: alert, no obvious focal deficits Psych: Normal affect and mood  ASSESSMENT/PLAN:   Back pain Assessment: 44 year old female with a history of chronic low back pain with recent MRI showing moderate impingement at L4-L5 and mild impingement L3/L4 and L5/S1.  Patient has been waiting to get in with neurosurgery and states that she just heard from them with an appointment scheduled for tomorrow.  Patient kept her appointment today with [place] but plans to follow-up with neurosurgery tomorrow to see their recommendations.  No red flag symptoms. Plan: -Patient to see  neurosurgery with scheduled appointment tomorrow -We will not make any changes at this time until we see neurosurgery's recommendations -Patient plans to follow-up with [place] after her neurosurgery appointment as needed, according to the recommendations.    Lurline Del, [place]

## 2020-07-01 ENCOUNTER — Telehealth: Payer: Self-pay

## 2020-07-01 DIAGNOSIS — M48061 Spinal stenosis, lumbar region without neurogenic claudication: Secondary | ICD-10-CM | POA: Insufficient documentation

## 2020-07-01 DIAGNOSIS — R030 Elevated blood-pressure reading, without diagnosis of hypertension: Secondary | ICD-10-CM | POA: Insufficient documentation

## 2020-07-01 DIAGNOSIS — Z6841 Body Mass Index (BMI) 40.0 and over, adult: Secondary | ICD-10-CM | POA: Insufficient documentation

## 2020-07-01 NOTE — Telephone Encounter (Signed)
Patient calls nurse line stating that her surgeon is not prescribing pain medication. Patient states that she was told in her last visit to return call to office if she needed PCP to send in her pain medication.   Patient requesting refill on oxycodone- 30 day supply. Medication is not on current medication list.   To PCP  Please advise  Talbot Grumbling, RN

## 2020-07-02 NOTE — Telephone Encounter (Signed)
Called patient and discussed with her that I am not going to start a new prescription of oxycodone. She asked about refilling her tramadol and I discussed with her that I did not think that would be in her best interest long term as this is typically meant to be a short term medication and she has now had several refills on it. Patient expressed disappointment at this but was understanding. She states that neurosurgery plans to do epidural injections and have put in a referral for these but she does not know when she will get them. I asked her to please let me know if I can help in anyway with this process and to let us know if she does not hear from the referral about the epidural injections soon. I discussed that she should return to physical therapy soon, especially after she gets her epidural injections to continue with mobility and improve her overall function. I discussed that she can continue on her gabapentin, acetaminophen, and lidocaine patches in the mean time for pain control of her lower back.

## 2020-07-05 NOTE — Progress Notes (Deleted)
    SUBJECTIVE:   CHIEF COMPLAINT / HPI:   Low back pain  PERTINENT  PMH / PSH: ***  OBJECTIVE:   LMP 06/14/2020    General: NAD, pleasant, able to participate in exam Cardiac: RRR, no murmurs. Respiratory: CTAB, normal effort, No wheezes, rales or rhonchi Abdomen: Bowel sounds present, nontender, nondistended, no hepatosplenomegaly. Extremities: no edema or cyanosis. Skin: warm and dry, no rashes noted Neuro: alert, no obvious focal deficits Psych: Normal affect and mood  ASSESSMENT/PLAN:   No problem-specific Assessment & Plan notes found for this encounter.     Sydney Cobb, [place]    This note was prepared using Dragon voice recognition software and may include unintentional dictation errors due to the inherent limitations of voice recognition software.

## 2020-07-06 ENCOUNTER — Ambulatory Visit: Payer: Medicare PPO

## 2020-07-15 ENCOUNTER — Telehealth: Payer: Self-pay | Admitting: *Deleted

## 2020-07-15 NOTE — Telephone Encounter (Signed)
Patient called and is requesting a referral to see [facility] center for pain management.  Will forward to MD to place this referral.  Johnney Ou

## 2020-07-16 ENCOUNTER — Other Ambulatory Visit: Payer: Self-pay | Admitting: Family Medicine

## 2020-07-16 DIAGNOSIS — M5440 Lumbago with sciatica, unspecified side: Secondary | ICD-10-CM

## 2020-07-16 DIAGNOSIS — G8929 Other chronic pain: Secondary | ICD-10-CM

## 2020-07-16 NOTE — Telephone Encounter (Signed)
Referral placed.

## 2020-08-03 DIAGNOSIS — M5136 Other intervertebral disc degeneration, lumbar region: Secondary | ICD-10-CM | POA: Insufficient documentation

## 2020-08-03 DIAGNOSIS — M51369 Other intervertebral disc degeneration, lumbar region without mention of lumbar back pain or lower extremity pain: Secondary | ICD-10-CM | POA: Insufficient documentation

## 2020-10-07 ENCOUNTER — Other Ambulatory Visit (HOSPITAL_COMMUNITY)
Admission: RE | Admit: 2020-10-07 | Discharge: 2020-10-07 | Disposition: A | Discharge status: Home / Self Care | Payer: Medicare Other | Source: Ambulatory Visit | Attending: Family Medicine | Admitting: Family Medicine

## 2020-10-07 ENCOUNTER — Ambulatory Visit: Payer: Medicare PPO | Admitting: Family Medicine

## 2020-10-07 ENCOUNTER — Other Ambulatory Visit: Payer: Self-pay

## 2020-10-07 ENCOUNTER — Ambulatory Visit (INDEPENDENT_AMBULATORY_CARE_PROVIDER_SITE_OTHER): Payer: Medicare Other | Admitting: Family Medicine

## 2020-10-07 ENCOUNTER — Encounter: Payer: Self-pay | Admitting: Family Medicine

## 2020-10-07 VITALS — BP 126/80 | HR 70 | Wt 214.0 lb

## 2020-10-07 DIAGNOSIS — N898 Other specified noninflammatory disorders of vagina: Secondary | ICD-10-CM | POA: Diagnosis not present

## 2020-10-07 NOTE — Assessment & Plan Note (Signed)
Physical exam unremarkable. Vaginal swab performed for GC chlamydia, trichomoniasis, BV, yeast.  Send out lab will return in 1 to 2 days.  Will contact patient with results.

## 2020-10-07 NOTE — Patient Instructions (Signed)
It was wonderful to meet you today. Thank you for allowing me to be a part of your care. Below is a short summary of what we discussed at your visit today:  Vaginal Complaints Today we performed a vaginal swab to test for gonorrhea, chlamydia, trichomonas, yeast infection, and bacterial vaginosis. The lab has to be sent out to an outside lab and should have the results ready in 1 to 2 days.  If everything is normal, I will send you a message in my chart with the normal results.  If anything returns abnormal or positive, I will give you call with results and plan going forward.   If you have any questions or concerns, please do not hesitate to contact us via phone or MyChart message.   Fayette Pho, MD

## 2020-10-07 NOTE — Progress Notes (Signed)
    SUBJECTIVE:   CHIEF COMPLAINT / HPI:   Vaginal complaints One week history. Light white cloudy vaginal discharge (enough for panty liner), "really bad" odor. "Feels like I need to use the bathroom sometimes when I don't need to urinate". Will try to urinate but only drops come out. Some vulvar pain intermittently. New partner. No rashes, lesions. No dysuria. Normal amount of urine in a day overall, but has some retention. Not able to fully empty bladder all the way, has to lie down for 5-10 minutes and go to the bathroom. Urine dark brown cloudy. No fever, chills, nausea, vomiting, diarrhea, constipation. Unprotected sex, not concerned for STDs.   PERTINENT  PMH / PSH: Chronic right shoulder pain, left knee pain, back pain, history of depression, macrocytic anemia, long-term NSAID use  OBJECTIVE:   BP 126/80   Pulse 70   Wt 214 lb (97.1 kg)   LMP 10/07/2020   SpO2 98%   BMI 36.73 kg/m    PHQ-9:  Depression screen [facility] 2/9 10/07/2020 06/30/2020 05/28/2020  Decreased Interest 0 0 3  Down, Depressed, Hopeless 0 0 1  PHQ - 2 Score 0 0 4  Altered sleeping 0 0 3  Tired, decreased energy 1 0 3  Change in appetite 1 0 1  Feeling bad or failure about yourself  0 0 1  Trouble concentrating 2 0 1  Moving slowly or fidgety/restless 0 0 3  Suicidal thoughts 0 0 0  PHQ-9 Score 4 0 16  Difficult doing work/chores - - Extremely dIfficult     GAD-7: No flowsheet data found.    Physical Exam General: Awake, alert, oriented, no acute distress Respiratory: Normal work of breathing, no respiratory distress Neuro: Cranial nerves II through X grossly intact, able to move all extremities spontaneously Vulva: Normal appearing external female genitalia without rashes, lesions, or deformities Vagina: Pale pink vaginal tissue without obvious lesions, blood in vagina due to active menstruation, physiologic discharge of whitish color, cervix without lesion or overt tenderness with  swab   ASSESSMENT/PLAN:   Vaginal discharge Physical exam unremarkable. Vaginal swab performed for GC chlamydia, trichomoniasis, BV, yeast.  Send out lab will return in 1 to 2 days.  Will contact patient with results.     Fayette Pho, MD [facility]

## 2020-10-09 ENCOUNTER — Encounter: Payer: Self-pay | Admitting: Family Medicine

## 2020-10-09 ENCOUNTER — Telehealth: Payer: Self-pay | Admitting: Family Medicine

## 2020-10-09 DIAGNOSIS — A5901 Trichomonal vulvovaginitis: Secondary | ICD-10-CM

## 2020-10-09 DIAGNOSIS — B9689 Other specified bacterial agents as the cause of diseases classified elsewhere: Secondary | ICD-10-CM

## 2020-10-09 LAB — CERVICOVAGINAL ANCILLARY ONLY
Bacterial Vaginitis (gardnerella): POSITIVE — AB
Candida Glabrata: NEGATIVE
Candida Vaginitis: NEGATIVE
Comment: NEGATIVE
Comment: NEGATIVE
Comment: NEGATIVE
Comment: NEGATIVE
Trichomonas: POSITIVE — AB

## 2020-10-09 MED ORDER — METRONIDAZOLE 500 MG PO TABS: 500.0000 mg | ORAL_TABLET | Freq: Two times a day (BID) | ORAL | 0 refills | Status: DC

## 2020-10-09 NOTE — Telephone Encounter (Signed)
Attempted to contact Ms. Stubbe to discuss positive results for trichomonas and BV. Telephone number listed rang for a while until finally being listed as not in service. Will send MyChart message and letter.   Ordered metronidazole 5 mg twice daily x7 days.  Ezequiel Essex, MD

## 2020-10-13 ENCOUNTER — Ambulatory Visit (HOSPITAL_COMMUNITY)
Admission: EM | Admit: 2020-10-13 | Discharge: 2020-10-13 | Disposition: A | Discharge status: Home / Self Care | Payer: Medicare Other | Attending: Student | Admitting: Student

## 2020-10-13 ENCOUNTER — Encounter (HOSPITAL_COMMUNITY): Payer: Self-pay

## 2020-10-13 ENCOUNTER — Other Ambulatory Visit: Payer: Self-pay

## 2020-10-13 DIAGNOSIS — U071 COVID-19: Secondary | ICD-10-CM | POA: Diagnosis not present

## 2020-10-13 DIAGNOSIS — J069 Acute upper respiratory infection, unspecified: Secondary | ICD-10-CM | POA: Diagnosis present

## 2020-10-13 LAB — POC INFLUENZA A AND B ANTIGEN (URGENT CARE ONLY)
Influenza A Ag: NEGATIVE
Influenza B Ag: NEGATIVE

## 2020-10-13 LAB — SARS CORONAVIRUS 2 (TAT 6-24 HRS): SARS Coronavirus 2: POSITIVE — AB

## 2020-10-13 MED ORDER — DM-GUAIFENESIN ER 30-600 MG PO TB12
1.0000 | ORAL_TABLET | Freq: Two times a day (BID) | ORAL | 0 refills | Status: DC
Start: 2020-10-13 — End: 2021-02-04

## 2020-10-13 MED ORDER — PROMETHAZINE-DM 6.25-15 MG/5ML PO SYRP
5.0000 mL | ORAL_SOLUTION | Freq: Four times a day (QID) | ORAL | 0 refills | Status: DC | PRN
Start: 2020-10-13 — End: 2021-02-04

## 2020-10-13 NOTE — ED Triage Notes (Signed)
Patient states she has had a sore throat since yesterday. Pt complains of body aches and chills as well as a cough that started this morning. Pt is aox4 and ambulatory.

## 2020-10-13 NOTE — Discharge Instructions (Addendum)
-  Mucinex DM for cough and congestion during daytime -Promethazine DM cough syrup for cough and congestion at night. This could make you drowsy so don't  take it before driving.  We are currently awaiting result of your PCR covid-19 test. This typically comes back in 1-2 days. We'll call you if the result is positive. Otherwise, the result will be sent electronically to your MyChart. You can also call this clinic and ask for your result via telephone.   Please isolate at home while awaiting these results. If your test is positive for Covid-19, continue to isolate at home for 5 days if you have mild symptoms, or a total of 10 days from symptom onset if you have more severe symptoms. If you quarantine for a shorter period of time (i.e. 5 days), make sure to wear a mask until day 10 of symptoms. Treat your symptoms at home with OTC remedies like tylenol/ibuprofen, mucinex, nyquil, etc. Seek medical attention if you develop high fevers, chest pain, shortness of breath, ear pain, facial pain, etc. Make sure to get up and move around every 2-3 hours while convalescing to help prevent blood clots. Drink plenty of fluids, and rest as much as possible.

## 2020-10-13 NOTE — ED Provider Notes (Signed)
[facility]    CSN: [id number] Arrival date & time: 10/13/20  1046      History   Chief Complaint Chief Complaint  Patient presents with  . Generalized Body Aches    This am  . Sore Throat    Since yesterday  . Nasal Congestion    This am  . Chills    This am    HPI Sydney Cobb is a 45 y.o. female Presenting for URI symptoms for 12 hours. History of anemia, chronic right shoulder pain, left knee pain, back pain, depression. Presenting today with sore throat, body aches, chills, cough. Hasn't checked temperature at home. Not taking anything for symptoms. Denies  n/v/d, shortness of breath, chest pain, congestion, facial pain, teeth pain, headaches,loss of taste/smell, swollen lymph nodes, ear pain.  Denies chest pain, shortness of breath, confusion, high fevers.  Fully vaccinated for covid-19.   HPI  History reviewed. No pertinent past medical history.  Patient Active Problem List   Diagnosis Date Noted  . Vaginal discharge 10/07/2020  . Microcytic anemia 05/28/2020  . NSAID long-term use 05/28/2020  . Chronic right shoulder pain 09/30/2019  . Left knee pain 09/30/2019  . Back pain 09/30/2019  . History of depression 09/30/2019    Past Surgical History:  Procedure Laterality Date  . BREAST REDUCTION SURGERY    . HAND SURGERY Right   . REDUCTION MAMMAPLASTY Bilateral 2014  . TUBAL LIGATION      OB History   No obstetric history on file.      Home Medications    Prior to Admission medications   Medication Sig Start Date End Date Taking? Authorizing Provider  acetaminophen (TYLENOL) 500 MG tablet Take 500 mg by mouth every 6 (six) hours as needed for moderate pain or headache.   Yes [provider]  dextromethorphan-guaiFENesin (MUCINEX DM) 30-600 MG 12hr tablet Take 1 tablet by mouth 2 (two) times daily. 10/13/20  Yes Hazel Sams, PA-C  gabapentin (NEURONTIN) 300 MG capsule Take 1 capsule (300 mg total) by mouth 3 (three) times  daily. 05/28/20  Yes Meccariello, Bernita Raisin, DO  naproxen sodium (ALEVE) 220 MG tablet Take 220 mg by mouth 2 (two) times daily as needed (pain).   Yes [provider]  promethazine-dextromethorphan (PROMETHAZINE-DM) 6.25-15 MG/5ML syrup Take 5 mLs by mouth 4 (four) times daily as needed for cough. 10/13/20  Yes Hazel Sams, PA-C  pantoprazole (PROTONIX) 40 MG tablet Take 1 tablet (40 mg total) by mouth daily. 05/28/20 10/13/20  Meccariello, Bernita Raisin, DO  potassium chloride (KLOR-CON) 10 MEQ tablet Take 1 tablet (10 mEq total) by mouth daily. 03/13/20 10/13/20  Garald Balding, PA-C    Family History Family History  Problem Relation Age of Onset  . Breast cancer Mother     Social History Social History   Tobacco Use  . Smoking status: Former Research scientist (life sciences)  . Smokeless tobacco: Never Used  Vaping Use  . Vaping Use: Never used  Substance Use Topics  . Alcohol use: Never  . Drug use: Never     Allergies   Patient has no known allergies.   Review of Systems Review of Systems  Constitutional: Positive for chills. Negative for appetite change and fever.  HENT: Positive for sore throat. Negative for congestion, ear pain, rhinorrhea, sinus pressure and sinus pain.   Eyes: Negative for redness and visual disturbance.  Respiratory: Positive for cough. Negative for chest tightness, shortness of breath and wheezing.   Cardiovascular: Negative  for chest pain and palpitations.  Gastrointestinal: Negative for abdominal pain, constipation, diarrhea, nausea and vomiting.  Genitourinary: Negative for dysuria, frequency and urgency.  Musculoskeletal: Positive for myalgias.  Neurological: Negative for dizziness, weakness and headaches.  Psychiatric/Behavioral: Negative for confusion.  All other systems reviewed and are negative.    Physical Exam Triage Vital Signs ED Triage Vitals  Enc Vitals Group     BP 10/13/20 1200 127/71     Pulse Rate 10/13/20 1200 84     Resp 10/13/20 1200 17      Temp 10/13/20 1200 98.3 F (36.8 C)     Temp Source 10/13/20 1200 Oral     SpO2 10/13/20 1200 100 %     Weight --      Height --      Head Circumference --      Peak Flow --      Pain Score 10/13/20 1203 7     Pain Loc --      Pain Edu? --      Excl. in [place]? --    No data found.  Updated Vital Signs BP 127/71 (BP Location: Right Arm)   Pulse 84   Temp 98.3 F (36.8 C) (Oral)   Resp 17   LMP 10/07/2020   SpO2 100%   Visual Acuity Right Eye Distance:   Left Eye Distance:   Bilateral Distance:    Right Eye Near:   Left Eye Near:    Bilateral Near:     Physical Exam Vitals reviewed.  Constitutional:      General: She is not in acute distress.    Appearance: Normal appearance. She is not ill-appearing.  HENT:     Head: Normocephalic and atraumatic.     Right Ear: Hearing, tympanic membrane, ear canal and external ear normal. No swelling or tenderness. There is no impacted cerumen. No mastoid tenderness. Tympanic membrane is not perforated, erythematous, retracted or bulging.     Left Ear: Hearing, tympanic membrane, ear canal and external ear normal. No swelling or tenderness. There is no impacted cerumen. No mastoid tenderness. Tympanic membrane is not perforated, erythematous, retracted or bulging.     Nose:     Right Sinus: No maxillary sinus tenderness or frontal sinus tenderness.     Left Sinus: No maxillary sinus tenderness or frontal sinus tenderness.     Mouth/Throat:     Mouth: Mucous membranes are moist.     Pharynx: Uvula midline. No oropharyngeal exudate or posterior oropharyngeal erythema.     Tonsils: No tonsillar exudate.  Cardiovascular:     Rate and Rhythm: Normal rate and regular rhythm.     Heart sounds: Normal heart sounds.  Pulmonary:     Breath sounds: Normal breath sounds and air entry. No wheezing, rhonchi or rales.  Chest:     Chest wall: No tenderness.  Abdominal:     General: Abdomen is flat. Bowel sounds are normal.     Tenderness:  There is no abdominal tenderness. There is no guarding or rebound.  Lymphadenopathy:     Cervical: No cervical adenopathy.  Neurological:     General: No focal deficit present.     Mental Status: She is alert and oriented to person, place, and time.  Psychiatric:        Attention and Perception: Attention and perception normal.        Mood and Affect: Mood and affect normal.        Behavior: Behavior normal. Behavior is  cooperative.        Thought Content: Thought content normal.        Judgment: Judgment normal.      UC Treatments / Results  Labs (all labs ordered are listed, but only abnormal results are displayed) Labs Reviewed  SARS CORONAVIRUS 2 (TAT 6-24 HRS)    EKG   Radiology No results found.  Procedures Procedures (including critical care time)  Medications Ordered in UC Medications - No data to display  Initial Impression / Assessment and Plan / UC Course  I have reviewed the triage vital signs and the nursing notes.  Pertinent labs & imaging results that were available during my care of the patient were reviewed by me and considered in my medical decision making (see chart for details).     Rapid Influenza negative. Covid test sent today. Patient is fully vaccinated for covid-19. Isolation precautions per CDC guidelines until negative result. Symptomatic relief with OTC Mucinex, Nyquil, etc. Return precautions- new/worsening fevers/chills, shortness of breath, chest pain, abd pain, etc.   Promethazine DM as below.  -For fevers/chills, body aches, headaches- use Tylenol and Ibuprofen. You can alternate these for maximum effect. Use up to 3000mg  Tylenol daily and 3200mg  Ibuprofen daily. Make sure to take ibuprofen with food. Check the bottle of ibuprofen/tylenol for specific dosage instructions.   Final Clinical Impressions(s) / UC Diagnoses   Final diagnoses:  Acute upper respiratory infection     Discharge Instructions     -Mucinex DM for cough  and congestion during daytime -Promethazine DM cough syrup for cough and congestion at night. This could make you drowsy so don't  take it before driving.  We are currently awaiting result of your PCR covid-19 test. This typically comes back in 1-2 days. We'll call you if the result is positive. Otherwise, the result will be sent electronically to your MyChart. You can also call this clinic and ask for your result via telephone.   Please isolate at home while awaiting these results. If your test is positive for Covid-19, continue to isolate at home for 5 days if you have mild symptoms, or a total of 10 days from symptom onset if you have more severe symptoms. If you quarantine for a shorter period of time (i.e. 5 days), make sure to wear a mask until day 10 of symptoms. Treat your symptoms at home with OTC remedies like tylenol/ibuprofen, mucinex, nyquil, etc. Seek medical attention if you develop high fevers, chest pain, shortness of breath, ear pain, facial pain, etc. Make sure to get up and move around every 2-3 hours while convalescing to help prevent blood clots. Drink plenty of fluids, and rest as much as possible.     ED Prescriptions    Medication Sig Dispense Auth. Provider   promethazine-dextromethorphan (PROMETHAZINE-DM) 6.25-15 MG/5ML syrup Take 5 mLs by mouth 4 (four) times daily as needed for cough. 118 mL Hazel Sams, PA-C   dextromethorphan-guaiFENesin [facility] DM) 30-600 MG 12hr tablet Take 1 tablet by mouth 2 (two) times daily. 20 tablet Hazel Sams, PA-C     PDMP not reviewed this encounter.   Hazel Sams, PA-C 10/13/20 1441

## 2020-10-22 ENCOUNTER — Other Ambulatory Visit: Payer: Self-pay | Admitting: Family Medicine

## 2020-10-22 DIAGNOSIS — G8929 Other chronic pain: Secondary | ICD-10-CM

## 2020-11-05 DIAGNOSIS — M5416 Radiculopathy, lumbar region: Secondary | ICD-10-CM | POA: Diagnosis not present

## 2020-11-05 DIAGNOSIS — M48061 Spinal stenosis, lumbar region without neurogenic claudication: Secondary | ICD-10-CM | POA: Diagnosis not present

## 2020-11-19 ENCOUNTER — Other Ambulatory Visit: Payer: Self-pay | Admitting: Family Medicine

## 2020-11-19 DIAGNOSIS — Z1231 Encounter for screening mammogram for malignant neoplasm of breast: Secondary | ICD-10-CM

## 2020-11-23 DIAGNOSIS — N926 Irregular menstruation, unspecified: Secondary | ICD-10-CM | POA: Diagnosis not present

## 2020-11-23 DIAGNOSIS — M539 Dorsopathy, unspecified: Secondary | ICD-10-CM | POA: Diagnosis not present

## 2020-11-23 DIAGNOSIS — M5432 Sciatica, left side: Secondary | ICD-10-CM | POA: Diagnosis not present

## 2020-11-23 DIAGNOSIS — Z79899 Other long term (current) drug therapy: Secondary | ICD-10-CM | POA: Diagnosis not present

## 2020-11-23 DIAGNOSIS — M47816 Spondylosis without myelopathy or radiculopathy, lumbar region: Secondary | ICD-10-CM | POA: Diagnosis not present

## 2020-12-15 DIAGNOSIS — M48061 Spinal stenosis, lumbar region without neurogenic claudication: Secondary | ICD-10-CM | POA: Diagnosis not present

## 2020-12-15 DIAGNOSIS — M5136 Other intervertebral disc degeneration, lumbar region: Secondary | ICD-10-CM | POA: Diagnosis not present

## 2020-12-23 DIAGNOSIS — M129 Arthropathy, unspecified: Secondary | ICD-10-CM | POA: Diagnosis not present

## 2020-12-23 DIAGNOSIS — M47816 Spondylosis without myelopathy or radiculopathy, lumbar region: Secondary | ICD-10-CM | POA: Diagnosis not present

## 2020-12-23 DIAGNOSIS — M539 Dorsopathy, unspecified: Secondary | ICD-10-CM | POA: Diagnosis not present

## 2020-12-23 DIAGNOSIS — M5432 Sciatica, left side: Secondary | ICD-10-CM | POA: Diagnosis not present

## 2020-12-23 DIAGNOSIS — N926 Irregular menstruation, unspecified: Secondary | ICD-10-CM | POA: Diagnosis not present

## 2020-12-23 DIAGNOSIS — E559 Vitamin D deficiency, unspecified: Secondary | ICD-10-CM | POA: Diagnosis not present

## 2020-12-23 DIAGNOSIS — Z79899 Other long term (current) drug therapy: Secondary | ICD-10-CM | POA: Diagnosis not present

## 2020-12-31 ENCOUNTER — Ambulatory Visit: Payer: Medicare HMO | Admitting: Podiatry

## 2021-01-07 ENCOUNTER — Ambulatory Visit: Payer: Medicare HMO | Admitting: Podiatry

## 2021-01-11 ENCOUNTER — Inpatient Hospital Stay: Admission: RE | Admit: 2021-01-11 | Payer: Medicare Other | Source: Ambulatory Visit

## 2021-01-22 DIAGNOSIS — N926 Irregular menstruation, unspecified: Secondary | ICD-10-CM | POA: Diagnosis not present

## 2021-01-22 DIAGNOSIS — M47816 Spondylosis without myelopathy or radiculopathy, lumbar region: Secondary | ICD-10-CM | POA: Diagnosis not present

## 2021-01-22 DIAGNOSIS — M539 Dorsopathy, unspecified: Secondary | ICD-10-CM | POA: Diagnosis not present

## 2021-01-22 DIAGNOSIS — Z79899 Other long term (current) drug therapy: Secondary | ICD-10-CM | POA: Diagnosis not present

## 2021-01-26 ENCOUNTER — Ambulatory Visit: Payer: Medicare HMO | Admitting: Podiatry

## 2021-02-04 ENCOUNTER — Encounter: Payer: Self-pay | Admitting: Podiatry

## 2021-02-04 ENCOUNTER — Ambulatory Visit (INDEPENDENT_AMBULATORY_CARE_PROVIDER_SITE_OTHER): Payer: Medicare HMO | Admitting: Podiatry

## 2021-02-04 ENCOUNTER — Other Ambulatory Visit: Payer: Self-pay

## 2021-02-04 DIAGNOSIS — L608 Other nail disorders: Secondary | ICD-10-CM | POA: Diagnosis not present

## 2021-02-04 DIAGNOSIS — L600 Ingrowing nail: Secondary | ICD-10-CM | POA: Diagnosis not present

## 2021-02-04 DIAGNOSIS — L603 Nail dystrophy: Secondary | ICD-10-CM | POA: Diagnosis not present

## 2021-02-04 MED ORDER — NEOMYCIN-POLYMYXIN-HC 1 % OT SOLN
OTIC | 1 refills | Status: DC
Start: 2021-02-04 — End: 2021-06-03

## 2021-02-04 NOTE — Progress Notes (Signed)
  Subjective:  Patient ID: Sydney Cobb, female    DOB: [birth year],  MRN: [id number] HPI Chief Complaint  Patient presents with  . Toe Pain    Hallux bilateral - both border, ingrown, thick nails x several months, left hallux stings with shoes, tries to keep trimmed  . New Patient (Initial Visit)    45 y.o. female presents with the above complaint.   ROS: Denies fever chills nausea vomiting muscle aches pains calf pain back pain chest pain shortness of breath.  No past medical history on file. Past Surgical History:  Procedure Laterality Date  . BREAST REDUCTION SURGERY    . HAND SURGERY Right   . REDUCTION MAMMAPLASTY Bilateral 2014  . TUBAL LIGATION      Current Outpatient Medications:  Marland Kitchen  Multiple Vitamin (MULTIVITAMIN) capsule, Take 1 capsule by mouth daily., Disp: , Rfl:  .  NEOMYCIN-POLYMYXIN-HYDROCORTISONE (CORTISPORIN) 1 % SOLN OTIC solution, Apply 1-2 drops to toe BID after soaking, Disp: 10 mL, Rfl: 1 .  gabapentin (NEURONTIN) 300 MG capsule, TAKE 1 CAPSULE(300 MG) BY MOUTH THREE TIMES DAILY, Disp: 90 capsule, Rfl: 3 .  naloxone (NARCAN) nasal spray 4 mg/0.1 mL, , Disp: , Rfl:  .  oxyCODONE-acetaminophen (PERCOCET) 10-325 MG tablet, , Disp: , Rfl:  .  Vitamin D, Ergocalciferol, (DRISDOL) 1.25 MG (50000 UNIT) CAPS capsule, Take 1 capsule by mouth once a week., Disp: , Rfl:   No Known Allergies Review of Systems Objective:  There were no vitals filed for this visit.  General: Well developed, nourished, in no acute distress, alert and oriented x3   Dermatological: Skin is warm, dry and supple bilateral. Nails x 10 are well maintained; remaining integument appears unremarkable at this time. There are no open sores, no preulcerative lesions, no rash or signs of infection present.  Severely deformed dystrophic painful nail hallux right.  Sharp incurvated nails on the tibia-fibula border the hallux left.  Vascular: Dorsalis Pedis artery and Posterior Tibial artery pedal  pulses are 2/4 bilateral with immedate capillary fill time. Pedal hair growth present. No varicosities and no lower extremity edema present bilateral.   Neruologic: Grossly intact via light touch bilateral. Vibratory intact via tuning fork bilateral. Protective threshold with Semmes Wienstein monofilament intact to all pedal sites bilateral. Patellar and Achilles deep tendon reflexes 2+ bilateral. No Babinski or clonus noted bilateral.   Musculoskeletal: No gross boney pedal deformities bilateral. No pain, crepitus, or limitation noted with foot and ankle range of motion bilateral. Muscular strength 5/5 in all groups tested bilateral.  Gait: Unassisted, Nonantalgic.    Radiographs:  None taken  Assessment & Plan:   Assessment: Ingrown toenails to a fibular border of the hallux bilateral.  Plan: Chemical matricectomy's were performed today to tibiofibular border of the hallux left and a total nail avulsion with matrixectomy hallux right.  Both old and home-going structures were provided with a prescription for Cortisporin Otic to be applied twice daily after soaking.  Follow-up with her in 2 weeks     Dacoda Finlay T. [place], [place]

## 2021-02-04 NOTE — Patient Instructions (Signed)

## 2021-02-04 NOTE — Addendum Note (Signed)
Addended by: Clovis Riley E on: 02/04/2021 02:55 PM   Modules accepted: Orders

## 2021-02-17 ENCOUNTER — Other Ambulatory Visit: Payer: Self-pay

## 2021-02-17 ENCOUNTER — Ambulatory Visit (INDEPENDENT_AMBULATORY_CARE_PROVIDER_SITE_OTHER): Payer: Medicare HMO | Admitting: Podiatry

## 2021-02-17 DIAGNOSIS — L600 Ingrowing nail: Secondary | ICD-10-CM | POA: Diagnosis not present

## 2021-02-17 DIAGNOSIS — L603 Nail dystrophy: Secondary | ICD-10-CM

## 2021-02-17 NOTE — Progress Notes (Signed)
   Subjective: 45 y.o. female presents today status post permanent nail avulsion procedure of the medial and lateral border of the left great toe as well as total permanent nail avulsion/matrixectomy right hallux that was performed on 02/04/2021 with Dr. Milinda Pointer.  Patient states she is feeling well.  She continues to have some pain and sensitivity to the right great toe.  No new complaints at this time.   No past medical history on file.  Objective: Skin is warm, dry and supple. Nail and respective nail fold appears to be healing appropriately. Open wound to the associated nail fold with a granular wound base and moderate amount of fibrotic tissue. Minimal drainage noted. Mild erythema around the periungual region likely due to phenol chemical matricectomy. Right hallux nail bed appears healthy and viable with good routine healing.  Assessment: #1 postop permanent partial nail avulsion medial and lateral border left hallux.  Total permanent nail matricectomy right hallux #2 open wound periungual nail fold of respective digit.   Plan of care: #1 patient was evaluated  #2  Light debridement of open wound was performed to the periungual border of the respective toe using a currette. Antibiotic ointment and Band-Aid was applied. #3 patient is to return to clinic on a PRN basis.   Edrick Kins, DPM [facility]  Dr. Edrick Kins, DPM    [address], [place] [address]                Office [phone]  Fax [phone]

## 2021-02-22 DIAGNOSIS — Z79899 Other long term (current) drug therapy: Secondary | ICD-10-CM | POA: Diagnosis not present

## 2021-02-22 DIAGNOSIS — Z6837 Body mass index (BMI) 37.0-37.9, adult: Secondary | ICD-10-CM | POA: Diagnosis not present

## 2021-02-22 DIAGNOSIS — M47816 Spondylosis without myelopathy or radiculopathy, lumbar region: Secondary | ICD-10-CM | POA: Diagnosis not present

## 2021-02-22 DIAGNOSIS — N926 Irregular menstruation, unspecified: Secondary | ICD-10-CM | POA: Diagnosis not present

## 2021-02-22 DIAGNOSIS — M539 Dorsopathy, unspecified: Secondary | ICD-10-CM | POA: Diagnosis not present

## 2021-02-23 ENCOUNTER — Ambulatory Visit: Payer: Medicare HMO | Admitting: Podiatry

## 2021-02-26 ENCOUNTER — Encounter: Payer: Self-pay | Admitting: Podiatry

## 2021-02-26 ENCOUNTER — Other Ambulatory Visit: Payer: Self-pay

## 2021-02-26 ENCOUNTER — Ambulatory Visit (INDEPENDENT_AMBULATORY_CARE_PROVIDER_SITE_OTHER): Payer: Medicare HMO | Admitting: Podiatry

## 2021-02-26 DIAGNOSIS — S91101A Unspecified open wound of right great toe without damage to nail, initial encounter: Secondary | ICD-10-CM | POA: Diagnosis not present

## 2021-03-04 ENCOUNTER — Encounter: Payer: Self-pay | Admitting: Podiatry

## 2021-03-04 NOTE — Progress Notes (Signed)
  Subjective:  Patient ID: Sydney Cobb, female    DOB: 12-Feb-1976,  MRN: [id number]  Chief Complaint  Patient presents with  . Ingrown Toenail    Nail check from an ingrown     45 y.o. female presents with the above complaint.  Patient presents with right hallux superficial wound status post a nail avulsion that was done previously.  She has states that she has some drainage she wanted to get it checked out.  She denies any other acute complaints she is not a diabetic.  She would like to discuss treatment options she has not seen anyone else prior to seeing me for this.  No clinical signs of infection noted.   Review of Systems: Negative except as noted in the HPI. Denies N/V/F/Ch.  No past medical history on file.  Current Outpatient Medications:  .  gabapentin (NEURONTIN) 300 MG capsule, TAKE 1 CAPSULE(300 MG) BY MOUTH THREE TIMES DAILY, Disp: 90 capsule, Rfl: 3 .  Multiple Vitamin (MULTIVITAMIN) capsule, Take 1 capsule by mouth daily., Disp: , Rfl:  .  naloxone (NARCAN) nasal spray 4 mg/0.1 mL, , Disp: , Rfl:  .  NEOMYCIN-POLYMYXIN-HYDROCORTISONE (CORTISPORIN) 1 % SOLN OTIC solution, Apply 1-2 drops to toe BID after soaking, Disp: 10 mL, Rfl: 1 .  oxyCODONE-acetaminophen (PERCOCET) 10-325 MG tablet, , Disp: , Rfl:  .  Vitamin D, Ergocalciferol, (DRISDOL) 1.25 MG (50000 UNIT) CAPS capsule, Take 1 capsule by mouth once a week., Disp: , Rfl:   Social History   Tobacco Use  Smoking Status Former Smoker  Smokeless Tobacco Never Used    No Known Allergies Objective:  There were no vitals filed for this visit. There is no height or weight on file to calculate BMI. Constitutional Well developed. Well nourished.  Vascular Dorsalis pedis pulses palpable bilaterally. Posterior tibial pulses palpable bilaterally. Capillary refill normal to all digits.  No cyanosis or clubbing noted. Pedal hair growth normal.  Neurologic Normal speech. Oriented to person, place, and  time. Epicritic sensation to light touch grossly present bilaterally.  Dermatologic  right hallux nail bed granular nail bed noted.  No clinical signs of infection noted no purulent drainage noted.  Healing appropriately.  Mild macerated skin tissue noted likely from Epsom salt.  Orthopedic: Normal joint ROM without pain or crepitus bilaterally. No visible deformities. No bony tenderness.   Radiographs: None Assessment:   1. Open wound of right great toe, initial encounter    Plan:  Patient was evaluated and treated and all questions answered.  Right hallux nailbed wound status post total nail avulsion -I explained to the patient the etiology of nail the wound and worse treatment options were discussed.  This is not clinically infected patient has nice granular healthy nail bed.  At this time this may likely be attributed from macerated skin tissue from Epsom salt soaking.  I encouraged her to stop that immediately I will put her on Betadine wet-to-dry dressings to allow the nailbed to dry out.  She will also benefit from a surgical shoe to take the pressure away from top of the nail.  She states understanding. -Surgical shoe was dispensed  No follow-ups on file.

## 2021-03-10 ENCOUNTER — Ambulatory Visit: Payer: Medicare HMO | Admitting: Podiatry

## 2021-03-19 ENCOUNTER — Ambulatory Visit: Payer: Medicare HMO | Admitting: Podiatry

## 2021-03-21 ENCOUNTER — Other Ambulatory Visit: Payer: Self-pay | Admitting: Family Medicine

## 2021-03-21 DIAGNOSIS — G8929 Other chronic pain: Secondary | ICD-10-CM

## 2021-03-24 ENCOUNTER — Other Ambulatory Visit: Payer: Self-pay

## 2021-03-24 ENCOUNTER — Encounter: Payer: Self-pay | Admitting: Podiatry

## 2021-03-24 ENCOUNTER — Ambulatory Visit (INDEPENDENT_AMBULATORY_CARE_PROVIDER_SITE_OTHER): Payer: Medicare HMO | Admitting: Podiatry

## 2021-03-24 DIAGNOSIS — S91101A Unspecified open wound of right great toe without damage to nail, initial encounter: Secondary | ICD-10-CM | POA: Diagnosis not present

## 2021-03-24 NOTE — Progress Notes (Signed)
  Subjective:  Patient ID: Sydney Cobb, female    DOB: 02-22-1976,  MRN: [id number]  Chief Complaint  Patient presents with   Nail Problem    Nail check     45 y.o. female presents with the above complaint.  Patient presents with a follow-up right hallux superficial wound status status post a nail avulsion.  She states she is doing a lot better is really looking well.  No longer pain.  She denies any other acute complaints.   Review of Systems: Negative except as noted in the HPI. Denies N/V/F/Ch.  No past medical history on file.  Current Outpatient Medications:    gabapentin (NEURONTIN) 300 MG capsule, TAKE 1 CAPSULE(300 MG) BY MOUTH THREE TIMES DAILY, Disp: 90 capsule, Rfl: 3   Multiple Vitamin (MULTIVITAMIN) capsule, Take 1 capsule by mouth daily., Disp: , Rfl:    naloxone (NARCAN) nasal spray 4 mg/0.1 mL, , Disp: , Rfl:    NEOMYCIN-POLYMYXIN-HYDROCORTISONE (CORTISPORIN) 1 % SOLN OTIC solution, Apply 1-2 drops to toe BID after soaking, Disp: 10 mL, Rfl: 1   oxyCODONE-acetaminophen (PERCOCET) 10-325 MG tablet, , Disp: , Rfl:    Vitamin D, Ergocalciferol, (DRISDOL) 1.25 MG (50000 UNIT) CAPS capsule, Take 1 capsule by mouth once a week., Disp: , Rfl:   Social History   Tobacco Use  Smoking Status Former   Pack years: 0.00  Smokeless Tobacco Never    No Known Allergies Objective:  There were no vitals filed for this visit. There is no height or weight on file to calculate BMI. Constitutional Well developed. Well nourished.  Vascular Dorsalis pedis pulses palpable bilaterally. Posterior tibial pulses palpable bilaterally. Capillary refill normal to all digits.  No cyanosis or clubbing noted. Pedal hair growth normal.  Neurologic Normal speech. Oriented to person, place, and time. Epicritic sensation to light touch grossly present bilaterally.  Dermatologic Right hallux hallux nail bed we epithelialized.  No longer wound noted.  No clinical signs of infection noted no  purulent drainage noted.  Healing appropriately.  Mild macerated skin tissue noted likely from Epsom salt.  Orthopedic: Normal joint ROM without pain or crepitus bilaterally. No visible deformities. No bony tenderness.   Radiographs: None Assessment:   1. Open wound of right great toe, initial encounter     Plan:  Patient was evaluated and treated and all questions answered.  Right hallux nailbed wound status post total nail avulsion -Clinically healed and completely epithelialized.  At this time patient can return to regular shoes.  She can discontinue surgical shoe.  I discussed shoe gear modification.  She can also apply a chronic nail.  She states understanding.  No follow-ups on file.

## 2021-03-25 DIAGNOSIS — M47816 Spondylosis without myelopathy or radiculopathy, lumbar region: Secondary | ICD-10-CM | POA: Diagnosis not present

## 2021-03-25 DIAGNOSIS — Z6837 Body mass index (BMI) 37.0-37.9, adult: Secondary | ICD-10-CM | POA: Diagnosis not present

## 2021-03-25 DIAGNOSIS — N926 Irregular menstruation, unspecified: Secondary | ICD-10-CM | POA: Diagnosis not present

## 2021-03-25 DIAGNOSIS — Z79899 Other long term (current) drug therapy: Secondary | ICD-10-CM | POA: Diagnosis not present

## 2021-03-25 DIAGNOSIS — M539 Dorsopathy, unspecified: Secondary | ICD-10-CM | POA: Diagnosis not present

## 2021-03-29 NOTE — Progress Notes (Signed)
    SUBJECTIVE:   CHIEF COMPLAINT / HPI:   Physical: 45 year old female presenting today for physical and to catch up on health maintenance items.  She is due for a Pap smear.  She is also due for recommended screening of HIV and hepatitis C.  She is also due for a colonoscopy.   Heavy menstrual bleeding: Patient states she has had heavy menstrual bleeding throughout her life. She has regular periods but bleeds for around 5-7 days. She goes through a bag of 20 pads in a week or less during her period.  She states in the past she used Depo-Provera which did help control her menstrual bleeding and she is interested in resuming this.  She states that Depo has always helped regulate her periods in the past.  She states that over the past several weeks she has noticed some bouts of lightheadedness if she suddenly stands up and thinks she may be a bit anemic.  PERTINENT  PMH / PSH: Hx of heavy menstrual bleeding.  OBJECTIVE:   BP 125/70   Pulse 78   Ht 5\' 4"  (1.626 m)   Wt 216 lb 12.8 oz (98.3 kg)   LMP 03/16/2021   SpO2 98%   BMI 37.21 kg/m    General: NAD, pleasant, able to participate in exam Cardiac: RRR, no murmurs. Respiratory: CTAB, normal effort Abdomen: Bowel sounds present, nontender, nondistended Extremities: no edema or cyanosis. Pelvic exam: VULVA: normal appearing vulva with no masses, tenderness or lesions, VAGINA: normal appearing vagina with normal color and discharge, no lesions, CERVIX: normal appearing cervix without discharge or lesions.  Chaperone Alexis Neuro: alert, no obvious focal deficits Psych: Normal affect and mood  ASSESSMENT/PLAN:   Excessive bleeding in premenopausal period 45 year old female with a history of heavy uterine bleeding.  She normally bleeds for about 5 to 7 days endorses approximately 20 pads or more during that period she has used Depo-Provera in the past which did help regulate her periods each time that she has used it and wishes to  restart it.  She has noted a little bit of lightheadedness when standing up over the past few weeks and thinks she may be anemic. -Urine pregnancy test performed today which was negative -We will initiate Depo-Provera -We will check CBC to look for anemia   Health maintenance: Pap smear performed Will screen for HIV and hepatitis C. Discussed that it is recommended she get a colonoscopy which she is agreeable to and this has been ordered.   Lurline Del, [place]

## 2021-03-31 ENCOUNTER — Ambulatory Visit (INDEPENDENT_AMBULATORY_CARE_PROVIDER_SITE_OTHER): Payer: Medicare HMO

## 2021-03-31 ENCOUNTER — Ambulatory Visit (INDEPENDENT_AMBULATORY_CARE_PROVIDER_SITE_OTHER): Payer: Medicare HMO | Admitting: Family Medicine

## 2021-03-31 ENCOUNTER — Other Ambulatory Visit (HOSPITAL_COMMUNITY)
Admission: RE | Admit: 2021-03-31 | Discharge: 2021-03-31 | Disposition: A | Discharge status: Home / Self Care | Payer: Medicare HMO | Source: Ambulatory Visit | Attending: Family Medicine | Admitting: Family Medicine

## 2021-03-31 ENCOUNTER — Other Ambulatory Visit: Payer: Self-pay

## 2021-03-31 ENCOUNTER — Encounter: Payer: Self-pay | Admitting: Family Medicine

## 2021-03-31 VITALS — BP 125/70 | HR 78 | Ht 64.0 in | Wt 216.8 lb

## 2021-03-31 DIAGNOSIS — Z124 Encounter for screening for malignant neoplasm of cervix: Secondary | ICD-10-CM | POA: Diagnosis not present

## 2021-03-31 DIAGNOSIS — Z01419 Encounter for gynecological examination (general) (routine) without abnormal findings: Secondary | ICD-10-CM | POA: Insufficient documentation

## 2021-03-31 DIAGNOSIS — Z3200 Encounter for pregnancy test, result unknown: Secondary | ICD-10-CM | POA: Diagnosis not present

## 2021-03-31 DIAGNOSIS — N924 Excessive bleeding in the premenopausal period: Secondary | ICD-10-CM | POA: Diagnosis not present

## 2021-03-31 DIAGNOSIS — Z113 Encounter for screening for infections with a predominantly sexual mode of transmission: Secondary | ICD-10-CM

## 2021-03-31 DIAGNOSIS — Z1211 Encounter for screening for malignant neoplasm of colon: Secondary | ICD-10-CM | POA: Diagnosis not present

## 2021-03-31 DIAGNOSIS — Z1151 Encounter for screening for human papillomavirus (HPV): Secondary | ICD-10-CM | POA: Insufficient documentation

## 2021-03-31 DIAGNOSIS — Z23 Encounter for immunization: Secondary | ICD-10-CM | POA: Diagnosis not present

## 2021-03-31 DIAGNOSIS — Z114 Encounter for screening for human immunodeficiency virus [HIV]: Secondary | ICD-10-CM

## 2021-03-31 DIAGNOSIS — Z789 Other specified health status: Secondary | ICD-10-CM

## 2021-03-31 DIAGNOSIS — Z1159 Encounter for screening for other viral diseases: Secondary | ICD-10-CM | POA: Diagnosis not present

## 2021-03-31 LAB — POCT URINE PREGNANCY: Preg Test, Ur: NEGATIVE

## 2021-03-31 MED ORDER — MEDROXYPROGESTERONE ACETATE 150 MG/ML IM SUSP
150.0000 mg | Freq: Once | INTRAMUSCULAR | Status: AC
  Administered 2021-03-31: 150 mg via INTRAMUSCULAR

## 2021-03-31 NOTE — Assessment & Plan Note (Signed)
45 year old female with a history of heavy uterine bleeding.  She normally bleeds for about 5 to 7 days endorses approximately 20 pads or more during that period she has used Depo-Provera in the past which did help regulate her periods each time that she has used it and wishes to restart it.  She has noted a little bit of lightheadedness when standing up over the past few weeks and thinks she may be anemic. -Urine pregnancy test performed today which was negative -We will initiate Depo-Provera -We will check CBC to look for anemia

## 2021-03-31 NOTE — Addendum Note (Signed)
Addended by: Lavell Anchors A on: 03/31/2021 10:07 AM   Modules accepted: Orders

## 2021-03-31 NOTE — Patient Instructions (Signed)
It was great to see you! Thank you for allowing me to participate in your care!  I recommend that you always bring your medications to each appointment as this makes it easy to ensure we are on the correct medications and helps [place] not miss when refills are needed.  Our plans for today:  -Today we performed your Pap smear and are checking some blood work. -We gave you your Depo-Provera shot today.  If you continue to have issues with heavy menstrual bleeding please let me know.  I will let you know the results of your CBC when it returns.  We are checking some labs today, I will call you if they are abnormal will send you a MyChart message or a letter if they are normal.  If you do not hear about your labs in the next 2 weeks please let us know.  Take care and seek immediate care sooner if you develop any concerns.   Dr. Lurline Del, [place]

## 2021-03-31 NOTE — Addendum Note (Signed)
Addended by: Lavell Anchors A on: 03/31/2021 09:56 AM   Modules accepted: Orders

## 2021-04-02 ENCOUNTER — Other Ambulatory Visit: Payer: Self-pay | Admitting: Family Medicine

## 2021-04-02 DIAGNOSIS — A539 Syphilis, unspecified: Secondary | ICD-10-CM

## 2021-04-02 HISTORY — DX: Syphilis, unspecified: A53.9

## 2021-04-02 LAB — CYTOLOGY - PAP
Chlamydia: NEGATIVE
Comment: NEGATIVE
Comment: NEGATIVE
Comment: NEGATIVE
Comment: NORMAL
Diagnosis: NEGATIVE
High risk HPV: NEGATIVE
Neisseria Gonorrhea: NEGATIVE
Trichomonas: NEGATIVE

## 2021-04-02 LAB — CBC
Hematocrit: 35.8 % (ref 34.0–46.6)
Hemoglobin: 11.5 g/dL (ref 11.1–15.9)
MCH: 27.8 pg (ref 26.6–33.0)
MCHC: 32.1 g/dL (ref 31.5–35.7)
MCV: 87 fL (ref 79–97)
Platelets: 294 10*3/uL (ref 150–450)
RBC: 4.13 x10E6/uL (ref 3.77–5.28)
RDW: 12.7 % (ref 11.7–15.4)
WBC: 4.7 10*3/uL (ref 3.4–10.8)

## 2021-04-02 LAB — RPR, QUANT+TP ABS (REFLEX)
Rapid Plasma Reagin, Quant: 1:1 {titer} — ABNORMAL HIGH
T Pallidum Abs: REACTIVE — AB

## 2021-04-02 LAB — HIV ANTIBODY (ROUTINE TESTING W REFLEX): HIV Screen 4th Generation wRfx: NONREACTIVE

## 2021-04-02 LAB — HCV INTERPRETATION

## 2021-04-02 LAB — HCV AB W REFLEX TO QUANT PCR: HCV Ab: <0.1 s/co ratio (ref 0.0–0.9)

## 2021-04-02 LAB — RPR: RPR Ser Ql: REACTIVE — AB

## 2021-04-02 NOTE — Progress Notes (Signed)
Called patient to discuss her results.  She did have a positive RPR, 1-1 point, and positive T palladium antibodies.  I discussed with the patient and she states that over 20 years ago she did receive treatment for syphilis.  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  I discussed the treatment which follows: 2,400,000 units of IM penicillin G every week for 3 weeks.  Patient is agreeable to this.  I have scheduled the patient in nurse clinic on 7/7 for her first visit.  She will receive 2.4 million units of IM penicillin G benzathine every week x 3 weeks.

## 2021-04-08 ENCOUNTER — Ambulatory Visit (INDEPENDENT_AMBULATORY_CARE_PROVIDER_SITE_OTHER): Payer: Medicare HMO

## 2021-04-08 ENCOUNTER — Other Ambulatory Visit: Payer: Self-pay

## 2021-04-08 DIAGNOSIS — A539 Syphilis, unspecified: Secondary | ICD-10-CM | POA: Diagnosis not present

## 2021-04-08 MED ORDER — PENICILLIN G BENZATHINE 1200000 UNIT/2ML IM SUSY
1.2000 10*6.[IU] | PREFILLED_SYRINGE | Freq: Once | INTRAMUSCULAR | Status: AC
  Administered 2021-04-08: 1.2 10*6.[IU] via INTRAMUSCULAR

## 2021-04-08 NOTE — Progress Notes (Signed)
Patient in nurse clinic today for STD treatment of syphilis.    Administered 1.2 million units of penicillin in [place]OQ and 1.2 million units in [place]. Patient tolerated injection well, sites unremarkable. Observed in clinic for 15 minutes post injection, no adverse reaction noted.   Patient will return to clinic next Thursday, 7/14 for set 2/3 of penicillin injections.   Talbot Grumbling, RN

## 2021-04-15 ENCOUNTER — Other Ambulatory Visit: Payer: Self-pay

## 2021-04-15 ENCOUNTER — Ambulatory Visit (INDEPENDENT_AMBULATORY_CARE_PROVIDER_SITE_OTHER): Payer: Medicare HMO

## 2021-04-15 DIAGNOSIS — A539 Syphilis, unspecified: Secondary | ICD-10-CM | POA: Diagnosis not present

## 2021-04-15 MED ORDER — PENICILLIN G BENZATHINE 1200000 UNIT/2ML IM SUSY
1.2000 10*6.[IU] | PREFILLED_SYRINGE | Freq: Once | INTRAMUSCULAR | Status: AC
  Administered 2021-04-15: 1.2 10*6.[IU] via INTRAMUSCULAR

## 2021-04-15 NOTE — Progress Notes (Signed)
Patient in nurse clinic today for STD treatment of syphilis.     Administered 1.2 million units of penicillin in RUOQ and 1.2 million units in [place]. Patient tolerated injection well, sites unremarkable. Observed in clinic for 15 minutes post injection, no adverse reaction noted.   Patient will return to clinic next Thursday, 7/21 for set 3/3 of penicillin injections.

## 2021-04-22 ENCOUNTER — Ambulatory Visit (INDEPENDENT_AMBULATORY_CARE_PROVIDER_SITE_OTHER): Payer: Medicare HMO | Admitting: *Deleted

## 2021-04-22 ENCOUNTER — Other Ambulatory Visit: Payer: Self-pay

## 2021-04-22 DIAGNOSIS — A539 Syphilis, unspecified: Secondary | ICD-10-CM | POA: Diagnosis not present

## 2021-04-22 MED ORDER — PENICILLIN G BENZATHINE 1200000 UNIT/2ML IM SUSY
1.2000 10*6.[IU] | PREFILLED_SYRINGE | Freq: Once | INTRAMUSCULAR | Status: AC
  Administered 2021-04-22: 1.2 10*6.[IU] via INTRAMUSCULAR

## 2021-04-22 NOTE — Progress Notes (Signed)
Patient in nurse clinic today for final STD treatment of syphilis.     Administered 1.2 million units of penicillin in RUOQ and 1.2 million units in [place]. Patient tolerated injection well, sites unremarkable. Observed in clinic for 15 minutes post injection, no adverse reaction noted.  Christen Bame, CMA

## 2021-04-23 ENCOUNTER — Ambulatory Visit: Payer: Medicare HMO | Admitting: Podiatry

## 2021-04-23 DIAGNOSIS — Z6837 Body mass index (BMI) 37.0-37.9, adult: Secondary | ICD-10-CM | POA: Diagnosis not present

## 2021-04-23 DIAGNOSIS — M539 Dorsopathy, unspecified: Secondary | ICD-10-CM | POA: Diagnosis not present

## 2021-04-23 DIAGNOSIS — N926 Irregular menstruation, unspecified: Secondary | ICD-10-CM | POA: Diagnosis not present

## 2021-04-23 DIAGNOSIS — M47816 Spondylosis without myelopathy or radiculopathy, lumbar region: Secondary | ICD-10-CM | POA: Diagnosis not present

## 2021-04-23 DIAGNOSIS — Z79899 Other long term (current) drug therapy: Secondary | ICD-10-CM | POA: Diagnosis not present

## 2021-04-28 ENCOUNTER — Ambulatory Visit: Payer: Medicare HMO | Admitting: Podiatry

## 2021-05-11 ENCOUNTER — Encounter: Payer: Self-pay | Admitting: Family Medicine

## 2021-05-11 ENCOUNTER — Other Ambulatory Visit: Payer: Self-pay

## 2021-05-11 ENCOUNTER — Ambulatory Visit (INDEPENDENT_AMBULATORY_CARE_PROVIDER_SITE_OTHER): Payer: Medicare HMO | Admitting: Family Medicine

## 2021-05-11 ENCOUNTER — Other Ambulatory Visit (HOSPITAL_COMMUNITY)
Admission: RE | Admit: 2021-05-11 | Discharge: 2021-05-11 | Disposition: A | Discharge status: Home / Self Care | Payer: Medicare HMO | Source: Ambulatory Visit | Attending: Family Medicine | Admitting: Family Medicine

## 2021-05-11 VITALS — BP 131/86 | HR 75 | Wt 215.2 lb

## 2021-05-11 DIAGNOSIS — R1030 Lower abdominal pain, unspecified: Secondary | ICD-10-CM | POA: Insufficient documentation

## 2021-05-11 DIAGNOSIS — N939 Abnormal uterine and vaginal bleeding, unspecified: Secondary | ICD-10-CM | POA: Insufficient documentation

## 2021-05-11 LAB — POCT WET PREP (WET MOUNT)
Clue Cells Wet Prep Whiff POC: POSITIVE
Trichomonas Wet Prep HPF POC: ABSENT

## 2021-05-11 LAB — POCT URINALYSIS DIP (MANUAL ENTRY)
Bilirubin, UA: NEGATIVE
Glucose, UA: NEGATIVE mg/dL
Leukocytes, UA: NEGATIVE
Nitrite, UA: NEGATIVE
Protein Ur, POC: NEGATIVE mg/dL
Spec Grav, UA: >=1.03 — AB (ref 1.010–1.025)
Urobilinogen, UA: 0.2 E.U./dL
pH, UA: 5.5 (ref 5.0–8.0)

## 2021-05-11 LAB — POCT UA - MICROSCOPIC ONLY

## 2021-05-11 LAB — POCT URINE PREGNANCY: Preg Test, Ur: NEGATIVE

## 2021-05-11 NOTE — Progress Notes (Signed)
    SUBJECTIVE:   CHIEF COMPLAINT / HPI: lower abdominal pain with spotting   Patient reports lower abdominal pain with vaginal bleeding. She states that pain has been present for 1 week. She just started back receiving depo injections in July 2022 after previous using Depo injections for several years. . Vaginal bleeding for two weeks that she reports as spotting. She describes it as light pink. Pain in abdomen is in center of lower abdomen on left side. She reports a hx of abnormal menses for several years as well. Pt has hx of positive chlamydia testing. Reports she is monogamous with her current partner.   PERTINENT  PMH / PSH:  Lumbar stenosis   OBJECTIVE:   BP 131/86   Pulse 75   Wt 215 lb 3.2 oz (97.6 kg)   SpO2 99%   BMI 36.94 kg/m   General: female appearing stated age in no acute distress Pulm:normal WOB on RA  Abdomen: Bowel sounds normal. Abdomen soft and non-tender.   Genitalia:  Normal introitus for age, no external lesions, no vaginal discharge, mucosa pink and moist, no vaginal or cervical lesions, no vaginal atrophy, no friaility or hemorrhage, normal uterus size and position, no adnexal masses or tenderness   ASSESSMENT/PLAN:   Vaginal spotting Patient may have abnormal spotting due to recently restarting Depo Provera injections of contraception, highly likely given hx of abnormal menses. Could also consider structural abnormalities that would need to be evaluated with transvaginal ultrasound if spotting increases/worsens.  - u/a  -wet prep -gonorrhea & chlamydia testing  -UPT     Eulis Foster, MD [place]

## 2021-05-11 NOTE — Patient Instructions (Signed)
Today we will collect labs to test for any potential infections that could be causing her lower abdominal pain.  I will notify you of any abnormal results and discussing follow-up plans once they are available.  If all of your lab work is normal, we may need to proceed with an ultrasound to evaluate for any structural abnormalities that may be causing your pain.

## 2021-05-12 ENCOUNTER — Other Ambulatory Visit: Payer: Self-pay | Admitting: Family Medicine

## 2021-05-12 MED ORDER — METRONIDAZOLE 500 MG PO TABS: 500.0000 mg | ORAL_TABLET | Freq: Three times a day (TID) | ORAL | 0 refills | Status: DC

## 2021-05-13 LAB — CERVICOVAGINAL ANCILLARY ONLY
Chlamydia: NEGATIVE
Comment: NEGATIVE
Comment: NORMAL
Neisseria Gonorrhea: NEGATIVE

## 2021-05-16 DIAGNOSIS — N939 Abnormal uterine and vaginal bleeding, unspecified: Secondary | ICD-10-CM | POA: Insufficient documentation

## 2021-05-16 NOTE — Assessment & Plan Note (Signed)
Patient may have abnormal spotting due to recently restarting Depo Provera injections of contraception, highly likely given hx of abnormal menses. Could also consider structural abnormalities that would need to be evaluated with transvaginal ultrasound if spotting increases/worsens.  - u/a  -wet prep -gonorrhea & chlamydia testing  -UPT

## 2021-05-19 DIAGNOSIS — M539 Dorsopathy, unspecified: Secondary | ICD-10-CM | POA: Diagnosis not present

## 2021-05-19 DIAGNOSIS — Z6837 Body mass index (BMI) 37.0-37.9, adult: Secondary | ICD-10-CM | POA: Diagnosis not present

## 2021-05-19 DIAGNOSIS — Z1159 Encounter for screening for other viral diseases: Secondary | ICD-10-CM | POA: Diagnosis not present

## 2021-05-19 DIAGNOSIS — Z79899 Other long term (current) drug therapy: Secondary | ICD-10-CM | POA: Diagnosis not present

## 2021-05-19 DIAGNOSIS — M129 Arthropathy, unspecified: Secondary | ICD-10-CM | POA: Diagnosis not present

## 2021-05-19 DIAGNOSIS — E559 Vitamin D deficiency, unspecified: Secondary | ICD-10-CM | POA: Diagnosis not present

## 2021-05-19 DIAGNOSIS — M47816 Spondylosis without myelopathy or radiculopathy, lumbar region: Secondary | ICD-10-CM | POA: Diagnosis not present

## 2021-05-19 DIAGNOSIS — N926 Irregular menstruation, unspecified: Secondary | ICD-10-CM | POA: Diagnosis not present

## 2021-05-19 DIAGNOSIS — Z9181 History of falling: Secondary | ICD-10-CM | POA: Diagnosis not present

## 2021-06-01 ENCOUNTER — Ambulatory Visit (INDEPENDENT_AMBULATORY_CARE_PROVIDER_SITE_OTHER): Payer: Medicare HMO | Admitting: Family Medicine

## 2021-06-01 ENCOUNTER — Encounter: Payer: Self-pay | Admitting: Family Medicine

## 2021-06-01 ENCOUNTER — Other Ambulatory Visit: Payer: Self-pay

## 2021-06-01 VITALS — BP 124/78 | HR 78 | Ht 64.0 in | Wt 212.2 lb

## 2021-06-01 DIAGNOSIS — N939 Abnormal uterine and vaginal bleeding, unspecified: Secondary | ICD-10-CM

## 2021-06-01 DIAGNOSIS — R202 Paresthesia of skin: Secondary | ICD-10-CM

## 2021-06-01 MED ORDER — DULOXETINE HCL 30 MG PO CPEP: 30.0000 mg | ORAL_CAPSULE | Freq: Every day | ORAL | 0 refills | Status: DC

## 2021-06-01 MED ORDER — MEGESTROL ACETATE 20 MG PO TABS: 40.0000 mg | ORAL_TABLET | Freq: Every day | ORAL | 0 refills | Status: DC

## 2021-06-01 MED ORDER — IBUPROFEN 600 MG PO TABS: 600.0000 mg | ORAL_TABLET | Freq: Three times a day (TID) | ORAL | 0 refills | Status: AC | PRN

## 2021-06-01 NOTE — Patient Instructions (Addendum)
Thank you for coming to see me today. It was a pleasure.   Start Megace 20 mg daily  Start ibuprofen 600 mg three times a day   I have ordered an ultrasound of your uterus We will discuss results at your next visit  Labs ordered today.  Will call if anything is abnormal.  If not will review at next visit  Use your wrist splints at night and when doing any activity.  Stop Gabapentin.  Start Cymbalta daily.  Will follow up in 2 weeks  Please follow-up with me in 2 weeks  If you have any questions or concerns, please do not hesitate to call the office at [phone].  Best,   Carollee Leitz, MD    Carpal Tunnel Syndrome  Carpal tunnel syndrome is a condition that causes pain, weakness, and numbness in your hand and arm. Numbness is when you cannot feel an area in your body. The carpal tunnel is a narrow area that is on the palm side of your wrist. Repeated wrist motion or certain diseases may cause swelling in the tunnel. This swelling can pinch the main nerve in the wrist. This nerve is called themedian nerve. What are the causes? This condition may be caused by: Moving your hand and wrist over and over again while doing a task. Injury to the wrist. Arthritis. A sac of fluid (cyst) or abnormal growth (tumor) in the carpal tunnel. Fluid buildup during pregnancy. Use of tools that vibrate. Sometimes the cause is not known. What increases the risk? The following factors may make you more likely to have this condition: Having a job that makes you do these things: Move your hand over and over again. Work with tools that vibrate, such as drills or sanders. Being a woman. Having diabetes, obesity, thyroid problems, or kidney failure. What are the signs or symptoms? Symptoms of this condition include: A tingling feeling in your fingers. Tingling or loss of feeling in your hand. Pain in your entire arm. This pain may get worse when you bend your wrist and elbow for a long time. Pain  in your wrist that goes up your arm to your shoulder. Pain that goes down into your palm or fingers. Weakness in your hands. You may find it hard to grab and hold items. You may feel worse at night. How is this treated? This condition may be treated with: Lifestyle changes. You will be asked to stop or change the activity that caused your problem. Doing exercises and activities that make bones, muscles, and tendons stronger (physical therapy). Learning how to use your hand again (occupational therapy). Medicines for pain and swelling. You may have injections in your wrist. A wrist splint or brace. Surgery. Follow these instructions at home: If you have a splint or brace: Wear the splint or brace as told by your doctor. Take it off only as told by your doctor. Loosen the splint if your fingers: Tingle. Become numb. Turn cold and blue. Keep the splint or brace clean. If the splint or brace is not waterproof: Do not let it get wet. Cover it with a watertight covering when you take a bath or a shower. Managing pain, stiffness, and swelling If told, put ice on the painful area: If you have a removable splint or brace, remove it as told by your doctor. Put ice in a plastic bag. Place a towel between your skin and the bag. Leave the ice on for 20 minutes, 2-3 times per day. Do  not fall asleep with the cold pack on your skin. Take off the ice if your skin turns bright red. This is very important. If you cannot feel pain, heat, or cold, you have a greater risk of damage to the area. Move your fingers often to reduce stiffness and swelling. General instructions Take over-the-counter and prescription medicines only as told by your doctor. Rest your wrist from any activity that may cause pain. If needed, talk with your boss at work about changes that can help your wrist heal. Do exercises as told by your doctor, physical therapist, or occupational therapist. Keep all follow-up visits. Contact  a doctor if: You have new symptoms. Medicine does not help your pain. Your symptoms get worse. Get help right away if: You have very bad numbness or tingling in your wrist or hand. Summary Carpal tunnel syndrome is a condition that causes pain in your hand and arm. It is often caused by repeated wrist motions. Lifestyle changes and medicines are used to treat this problem. Surgery may help in very bad cases. Follow your doctor's instructions about wearing a splint, resting your wrist, keeping follow-up visits, and calling for help. This information is not intended to replace advice given to you by your health care provider. Make sure you discuss any questions you have with your healthcare provider. Document Revised: 01/30/2020 Document Reviewed: 01/30/2020 Elsevier Patient Education  2022 [place].   Abnormal Uterine Bleeding Abnormal uterine bleeding means bleeding more than usual from your womb (uterus). It can include: Bleeding between menstrual periods. Bleeding after sex. Bleeding that is heavier than normal. Menstrual periods that last longer than usual. Bleeding after you have stopped having your menstrual period (menopause). There are many problems that may cause this. You should see a doctor for any kind of bleeding that is not normal. Treatment depends on the cause of thebleeding. Follow these instructions at home: Medicines Take over-the-counter and prescription medicines only as told by your doctor. Tell your doctor about other medicines that you take. If told by your doctor, stop taking aspirin or medicines that have aspirin in them. These medicines can make you bleed more. You may be given iron pills to replace iron that your body loses because of this condition. Take them as told by your doctor. Managing constipation If you are taking iron pills, you may have trouble pooping (constipation). To prevent or treat trouble pooping, you may need to: Drink enough fluid to  keep your pee (urine) pale yellow. Take over-the-counter or prescription medicines. Eat foods that are high in fiber. These include beans, whole grains, and fresh fruits and vegetables. Limit foods that are high in fat and sugar. These include fried or sweet foods. General instructions Watch your condition for any changes. Do not use tampons, douche, or have sex, if your doctor tells you not to. Change your pads often. Get regular exams. This includes pelvic exams and cervical cancer screenings. It is up to you to get the results of any tests that are done. Ask your doctor, or the department that is doing the tests, when your results will be ready. Keep all follow-up visits as told by your doctor. This is important. Contact a doctor if: The bleeding lasts more than 1 week. You feel dizzy at times. You feel like you may vomit (nausea). You vomit. You feel light-headed or weak. Your symptoms get worse. Get help right away if: You pass out. You have to change pads every hour. You have pain in your  belly. You have a fever or chills. You get sweaty. You get weak. You pass large blood clots from your vagina. Summary Abnormal uterine bleeding means bleeding more than usual from your womb (uterus). Any kind of bleeding that is not normal should be checked by a doctor. Treatment depends on the cause of the bleeding. Get help right away if you pass out, you have to change pads every hour, or you pass large blood clots from your vagina. This information is not intended to replace advice given to you by your health care provider. Make sure you discuss any questions you have with your healthcare provider. Document Revised: 07/23/2019 Document Reviewed: 07/23/2019 Elsevier Patient Education  [place] Exercises Hand exercises can be helpful for almost anyone. These exercises can strengthen the hands, improve flexibility and movement, and increase blood flow to the hands. These  results can make work and daily tasks easier. Hand exercises can be especially helpful for people who have joint pain from arthritis or have nerve damage from overuse (carpal tunnel syndrome). These exercises can also help people who have injured a hand. Exercises Most of these hand exercises are gentle stretching and motion exercises. It is usually safe to do them often throughout the day. Warming up your hands before exercise may help to reduce stiffness. You can do this with gentle massage or by placing your hands in warm water for 10-15 minutes. It is normal to feel some stretching, pulling, tightness, or mild discomfort as you begin new exercises. This will gradually improve. Stop an exercise right away if you feel sudden, severe pain or your pain gets worse. Ask your health care provider which exercises are best for you. Knuckle bend or "claw" fist  Stand or sit with your arm, hand, and all five fingers pointed straight up. Make sure to keep your wrist straight during the exercise. Gently bend your fingers down toward your palm until the tips of your fingers are touching the top of your palm. Keep your big knuckle straight and just bend the small knuckles in your fingers. Hold this position for __________ seconds. Straighten (extend) your fingers back to the starting position. Repeat this exercise 5-10 times with each hand. Full finger fist  Stand or sit with your arm, hand, and all five fingers pointed straight up. Make sure to keep your wrist straight during the exercise. Gently bend your fingers into your palm until the tips of your fingers are touching the middle of your palm. Hold this position for __________ seconds. Extend your fingers back to the starting position, stretching every joint fully. Repeat this exercise 5-10 times with each hand. Straight fist Stand or sit with your arm, hand, and all five fingers pointed straight up. Make sure to keep your wrist straight during the  exercise. Gently bend your fingers at the big knuckle, where your fingers meet your hand, and the middle knuckle. Keep the knuckle at the tips of your fingers straight and try to touch the bottom of your palm. Hold this position for __________ seconds. Extend your fingers back to the starting position, stretching every joint fully. Repeat this exercise 5-10 times with each hand. Tabletop  Stand or sit with your arm, hand, and all five fingers pointed straight up. Make sure to keep your wrist straight during the exercise. Gently bend your fingers at the big knuckle, where your fingers meet your hand, as far down as you can while keeping the small knuckles in your fingers  straight. Think of forming a tabletop with your fingers. Hold this position for __________ seconds. Extend your fingers back to the starting position, stretching every joint fully. Repeat this exercise 5-10 times with each hand. Finger spread  Place your hand flat on a table with your palm facing down. Make sure your wrist stays straight as you do this exercise. Spread your fingers and thumb apart from each other as far as you can until you feel a gentle stretch. Hold this position for __________ seconds. Bring your fingers and thumb tight together again. Hold this position for __________ seconds. Repeat this exercise 5-10 times with each hand. Making circles  Stand or sit with your arm, hand, and all five fingers pointed straight up. Make sure to keep your wrist straight during the exercise. Make a circle by touching the tip of your thumb to the tip of your index finger. Hold for __________ seconds. Then open your hand wide. Repeat this motion with your thumb and each finger on your hand. Repeat this exercise 5-10 times with each hand. Thumb motion  Sit with your forearm resting on a table and your wrist straight. Your thumb should be facing up toward the ceiling. Keep your fingers relaxed as you move your thumb. Lift  your thumb up as high as you can toward the ceiling. Hold for __________ seconds. Bend your thumb across your palm as far as you can, reaching the tip of your thumb for the small finger (pinkie) side of your palm. Hold for __________ seconds. Repeat this exercise 5-10 times with each hand. Grip strengthening  Hold a stress ball or other soft ball in the middle of your hand. Slowly increase the pressure, squeezing the ball as much as you can without causing pain. Think of bringing the tips of your fingers into the middle of your palm. All of your finger joints should bend when doing this exercise. Hold your squeeze for __________ seconds, then relax. Repeat this exercise 5-10 times with each hand. Contact a health care provider if: Your hand pain or discomfort gets much worse when you do an exercise. Your hand pain or discomfort does not improve within 2 hours after you exercise. If you have any of these problems, stop doing these exercises right away. Do not do them again unless your health care provider says that you can. Get help right away if: You develop sudden, severe hand pain or swelling. If this happens, stop doing these exercises right away. Do not do them again unless your health care provider says that you can. This information is not intended to replace advice given to you by your health care provider. Make sure you discuss any questions you have with your health care provider. Document Revised: 01/07/2021 Document Reviewed: 01/07/2021 Elsevier Patient Education  [place].

## 2021-06-01 NOTE — Progress Notes (Signed)
SUBJECTIVE:   CHIEF COMPLAINT / HPI: vaginal bleeding  Continues to have heavy periods since starting Depo injection 06/29.  Bleeding has slowed but has been daily 06/30.   Has gone through three packs of large night pads since that time.  Prior to injection had been having heavy flow of going through 20 pad day. Heavy menses started after birth of 4th child.   Denies any weakness, dizziness, shortness of breath, chest pain, abdominal pain or vaginal discharge.  Had not been on birth control for 3 years and when last on Depo had no heavy bleeding.   Bilateral hand numbness Started about 3 months ago.  Worse when wakening.  Has to shake hands to get sensation to return.  Takes Gabapentin 1 tablet 3-4 times a week as it gives her a headache.  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  History of active infection 0 yrs ago.  Denies any pain, weakness or decrease in sensation.   PERTINENT  PMH / PSH:  Tubal ligation PAP NLM, HPV neg 06/22 Maternal Breast cancer at age 45 OBJECTIVE:   BP 124/78   Pulse 78   Ht '5\' 4"'$  (1.626 m)   Wt 212 lb 3.2 oz (96.3 kg)   LMP 04/01/2021 (Exact Date)   SpO2 98%   BMI 36.42 kg/m    General: Alert, no acute distress Cardio: Normal S1 and S2, RRR, no r/m/g Pulm: CTAB, normal work of breathing Abdomen: Bowel sounds normal. Abdomen soft and non-tender.   Hand/Wrist Musculoskeletal Exam  Inspection   Right     Right hand/wrist inspection is normal.   Left     Left hand/wrist inspection is normal.  Palpation   Right     Right hand palpation is normal.     Right wrist palpation is normal.   Left      Left hand palpation is normal.     Left wrist palpation is normal.  Range of Motion   Right Hand     Right hand range of motion is normal.     Left Hand     Left hand range of motion is normal.      Right Wrist     Right wrist range of motion is normal.     Left Wrist     Left wrist range of motion is normal.    Strength   Right  Hand     Right hand strength is normal.     Left Hand     Left hand strength is normal.     Right Wrist     Right wrist strength is normal.     Left Wrist     Left wrist strength is normal.    Neurovascular   Right      Right neurovascular exam is normal.   Left      Left neurovascular exam is normal.  Special Tests   Right     Right special tests are normal.     Phalen's: negative     Allen's test: negative   Left     Left special tests are normal.     Phalen's: negative     Allen's test: negative    ASSESSMENT/PLAN:   Abnormal uterine bleeding (AUB) Heavy menses continues after initiation of Depo injection.  Abdominal exam benign.  Recent PAP NILM, HPV negative. Has family history of maternal breast cancer. -Start Megace 20 mg daily x 14/7 -Start Ibuprofen 600 mg TID x 14/7 -  Transvaginal u/s -Given age will likely need endometrial biopsy -CBC, TSH, Ferritin today -Scheduled for follow up appointment 3 weeks -Strict return precautions provided  Paresthesia of both hands Likely metacarpal given mostly happens when waking up at night and positive flick test. 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  Recent BMP wnl.  Exam today benign. -Wrist splints at night and when active  -Stop gabapentin -Switch to Duloxetine 30 mg daily -A1c, CBC today -Hand exercises provided -Follow up in 2-3 weeks and with lab results   Health Maintenance Colonoscopy ordered today PAP 06/22: NILM, HPV neg Mammogram ordered today Hep C screening: 06/22 HIV screening: 06/22 COVID Vaccine UTD PHQ9 score 0 A1c ordered today Statin: lipids panel ordered today   Carollee Leitz, MD [place]

## 2021-06-02 LAB — CBC
Hematocrit: 35.8 % (ref 34.0–46.6)
Hemoglobin: 11.5 g/dL (ref 11.1–15.9)
MCH: 28 pg (ref 26.6–33.0)
MCHC: 32.1 g/dL (ref 31.5–35.7)
MCV: 87 fL (ref 79–97)
Platelets: 274 10*3/uL (ref 150–450)
RBC: 4.11 x10E6/uL (ref 3.77–5.28)
RDW: 13.2 % (ref 11.7–15.4)
WBC: 4.9 10*3/uL (ref 3.4–10.8)

## 2021-06-02 LAB — LIPID PANEL
Chol/HDL Ratio: 5.2 ratio — ABNORMAL HIGH (ref 0.0–4.4)
Cholesterol, Total: 191 mg/dL (ref 100–199)
HDL: 37 mg/dL — ABNORMAL LOW (ref >39)
LDL Chol Calc (NIH): 132 mg/dL — ABNORMAL HIGH (ref 0–99)
Triglycerides: 119 mg/dL (ref 0–149)
VLDL Cholesterol Cal: 22 mg/dL (ref 5–40)

## 2021-06-02 LAB — TSH: TSH: 0.739 u[IU]/mL (ref 0.450–4.500)

## 2021-06-02 LAB — FERRITIN: Ferritin: 32 ng/mL (ref 15–150)

## 2021-06-03 ENCOUNTER — Encounter: Payer: Self-pay | Admitting: Family Medicine

## 2021-06-03 DIAGNOSIS — R202 Paresthesia of skin: Secondary | ICD-10-CM | POA: Insufficient documentation

## 2021-06-03 DIAGNOSIS — N939 Abnormal uterine and vaginal bleeding, unspecified: Secondary | ICD-10-CM | POA: Insufficient documentation

## 2021-06-03 NOTE — Assessment & Plan Note (Signed)
Heavy menses continues after initiation of Depo injection.  Abdominal exam benign.  Recent PAP NILM, HPV negative. Has family history of maternal breast cancer. -Start Megace 20 mg daily x 14/7 -Start Ibuprofen 600 mg TID x 14/7 -Transvaginal u/s -Given age will likely need endometrial biopsy -CBC, TSH, Ferritin today -Scheduled for follow up appointment 3 weeks -Strict return precautions provided

## 2021-06-03 NOTE — Assessment & Plan Note (Addendum)
Likely metacarpal given mostly happens when waking up at night and positive flick test. 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  Recent BMP wnl.  Exam today benign. -Wrist splints at night and when active  -Stop gabapentin -Switch to Duloxetine 30 mg daily -A1c, CBC today -Hand exercises provided -Follow up in 2-3 weeks and with lab results

## 2021-06-04 LAB — SPECIMEN STATUS REPORT

## 2021-06-04 LAB — HEMOGLOBIN A1C
Est. average glucose Bld gHb Est-mCnc: 111 mg/dL
Hgb A1c MFr Bld: 5.5 % (ref 4.8–5.6)

## 2021-06-15 ENCOUNTER — Other Ambulatory Visit: Payer: Self-pay | Admitting: Family Medicine

## 2021-06-15 ENCOUNTER — Ambulatory Visit (HOSPITAL_COMMUNITY)
Admission: RE | Admit: 2021-06-15 | Discharge: 2021-06-15 | Disposition: A | Discharge status: Home / Self Care | Payer: Medicare HMO | Source: Ambulatory Visit | Attending: Family Medicine | Admitting: Family Medicine

## 2021-06-15 ENCOUNTER — Encounter: Payer: Self-pay | Admitting: Family Medicine

## 2021-06-15 ENCOUNTER — Ambulatory Visit (INDEPENDENT_AMBULATORY_CARE_PROVIDER_SITE_OTHER): Payer: Medicare HMO | Admitting: Family Medicine

## 2021-06-15 ENCOUNTER — Other Ambulatory Visit: Payer: Self-pay

## 2021-06-15 VITALS — BP 131/69 | HR 64 | Ht 64.0 in | Wt 213.4 lb

## 2021-06-15 DIAGNOSIS — N939 Abnormal uterine and vaginal bleeding, unspecified: Secondary | ICD-10-CM

## 2021-06-15 DIAGNOSIS — Z789 Other specified health status: Secondary | ICD-10-CM

## 2021-06-15 DIAGNOSIS — Z23 Encounter for immunization: Secondary | ICD-10-CM

## 2021-06-15 MED ORDER — MEGESTROL ACETATE 20 MG PO TABS: 40.0000 mg | ORAL_TABLET | Freq: Every day | ORAL | 0 refills | Status: DC

## 2021-06-15 MED ORDER — MEDROXYPROGESTERONE ACETATE 150 MG/ML IM SUSP
150.0000 mg | Freq: Once | INTRAMUSCULAR | Status: AC
Start: 2021-06-15 — End: 2021-06-15
  Administered 2021-06-15: 150 mg via INTRAMUSCULAR

## 2021-06-15 NOTE — Progress Notes (Signed)
    SUBJECTIVE:   CHIEF COMPLAINT / HPI:   Presents for follow up for AUB. Seen in clinic on 08/30 and treated with Megace 20 mg daily and Ibuprofen 600 mg TID.  Since then patient reports small improvement in symptoms. Vaginal bleeding has lightened.  Using 3-4 pad a day.  Some clots but this has also lessened.  Does not desire pregnancy. Last day of Megace today.  Receiving second Depo injection today.  Pelvic ultrasound completed today.  Denies any dizziness, lightheadedness or weakness.  PERTINENT  PMH / PSH:  AUB  OBJECTIVE:   BP 131/69   Pulse 64   Ht '5\' 4"'$  (1.626 m)   Wt 213 lb 6 oz (96.8 kg)   SpO2 (!) 10%   BMI 36.63 kg/m    General: Alert, no acute distress Cardio: Normal S1 and S2, RRR, no r/m/g Pulm: CTAB, normal work of breathing Abdomen: Bowel sounds normal. Abdomen soft and non-tender.   ASSESSMENT/PLAN:   Abnormal uterine bleeding (AUB) Bleeding has improved with the Megace and Ibuprofen.  Second dose of Depo given today.  This maybe contributing to increase in bleeding.  Pelvic ultrasound report as read by radiologist. Heterogeneous myometrial echotexture with prominent fundus suggestive of adenomyosis. More focal hypoechoic region in the right uterine corpus measuring 3.1 cm could reflect fibroid or focal adenomyoma. 2. Endometrial thickness of 16 mm. If bleeding remains unresponsive to hormonal or medical therapy, focal lesion work-up with sonohysterogram should be considered. Endometrial biopsy should also be considered in pre-menopausal patients at high risk for endometrial carcinoma  Discussed with patient and initial plan was to book for endometrial biopsy.  Spoke with patient about result of above u/s  Recommendations, and a collaborative decision was made to go ahead with referral to [facility] for further evaluation. All questions answered. Increase Megace 40 mg daily x 30 days  Continue Ibuprofen 600 mg TID x 14 days Depo injection today, patient  wanting to continue Follow up as needed or if symptoms worsen     Carollee Leitz, MD [place]

## 2021-06-18 ENCOUNTER — Encounter: Payer: Self-pay | Admitting: Family Medicine

## 2021-06-18 NOTE — Assessment & Plan Note (Addendum)
Bleeding has improved with the Megace and Ibuprofen.  Second dose of Depo given today.  This maybe contributing to increase in bleeding.  Pelvic ultrasound report as read by radiologist. Heterogeneous myometrial echotexture with prominent fundus suggestive of adenomyosis. More focal hypoechoic region in the right uterine corpus measuring 3.1 cm could reflect fibroid or focal adenomyoma. 2. Endometrial thickness of 16 mm. If bleeding remains unresponsive to hormonal or medical therapy, focal lesion work-up with sonohysterogram should be considered. Endometrial biopsy should also be considered in pre-menopausal patients at high risk for endometrial carcinoma  Discussed with patient and initial plan was to book for endometrial biopsy.  Spoke with patient about result of above u/s  Recommendations, and a collaborative decision was made to go ahead with referral to [facility] for further evaluation. All questions answered. Increase Megace 40 mg daily x 30 days  Continue Ibuprofen 600 mg TID x 14 days Depo injection today, patient wanting to continue Follow up as needed or if symptoms worsen

## 2021-06-23 NOTE — Progress Notes (Signed)
She has been referred to [facility]

## 2021-06-24 DIAGNOSIS — M539 Dorsopathy, unspecified: Secondary | ICD-10-CM | POA: Diagnosis not present

## 2021-06-24 DIAGNOSIS — Z79899 Other long term (current) drug therapy: Secondary | ICD-10-CM | POA: Diagnosis not present

## 2021-06-24 DIAGNOSIS — Z6837 Body mass index (BMI) 37.0-37.9, adult: Secondary | ICD-10-CM | POA: Diagnosis not present

## 2021-06-24 DIAGNOSIS — Z9181 History of falling: Secondary | ICD-10-CM | POA: Diagnosis not present

## 2021-06-24 DIAGNOSIS — N926 Irregular menstruation, unspecified: Secondary | ICD-10-CM | POA: Diagnosis not present

## 2021-06-24 DIAGNOSIS — M47816 Spondylosis without myelopathy or radiculopathy, lumbar region: Secondary | ICD-10-CM | POA: Diagnosis not present

## 2021-06-30 ENCOUNTER — Other Ambulatory Visit: Payer: Self-pay | Admitting: *Deleted

## 2021-06-30 DIAGNOSIS — Z8659 Personal history of other mental and behavioral disorders: Secondary | ICD-10-CM

## 2021-06-30 NOTE — Progress Notes (Signed)
Patient called and is currently at [facility] for counseling and therapy but they will need a referral due to her insurance.  Referral placed and will update provider of this.  Archie Atilano,CMA

## 2021-07-06 ENCOUNTER — Ambulatory Visit: Payer: Medicare HMO

## 2021-07-07 ENCOUNTER — Other Ambulatory Visit: Payer: Self-pay | Admitting: Family Medicine

## 2021-07-07 DIAGNOSIS — R202 Paresthesia of skin: Secondary | ICD-10-CM

## 2021-07-14 ENCOUNTER — Ambulatory Visit
Admission: RE | Admit: 2021-07-14 | Discharge: 2021-07-14 | Disposition: A | Discharge status: Home / Self Care | Payer: Medicare HMO | Source: Ambulatory Visit | Attending: Family Medicine | Admitting: Family Medicine

## 2021-07-14 ENCOUNTER — Other Ambulatory Visit: Payer: Self-pay

## 2021-07-14 DIAGNOSIS — Z1231 Encounter for screening mammogram for malignant neoplasm of breast: Secondary | ICD-10-CM | POA: Diagnosis not present

## 2021-07-14 DIAGNOSIS — N939 Abnormal uterine and vaginal bleeding, unspecified: Secondary | ICD-10-CM

## 2021-07-15 ENCOUNTER — Emergency Department (HOSPITAL_COMMUNITY): Payer: Medicare HMO

## 2021-07-15 ENCOUNTER — Emergency Department (HOSPITAL_COMMUNITY)
Admission: EM | Admit: 2021-07-15 | Discharge: 2021-07-15 | Disposition: A | Discharge status: Home / Self Care | Payer: Medicare HMO | Attending: Emergency Medicine | Admitting: Emergency Medicine

## 2021-07-15 ENCOUNTER — Encounter (HOSPITAL_COMMUNITY): Payer: Self-pay | Admitting: Pharmacy Technician

## 2021-07-15 ENCOUNTER — Other Ambulatory Visit: Payer: Self-pay

## 2021-07-15 DIAGNOSIS — R110 Nausea: Secondary | ICD-10-CM | POA: Diagnosis not present

## 2021-07-15 DIAGNOSIS — R0789 Other chest pain: Secondary | ICD-10-CM | POA: Diagnosis not present

## 2021-07-15 DIAGNOSIS — N939 Abnormal uterine and vaginal bleeding, unspecified: Secondary | ICD-10-CM | POA: Insufficient documentation

## 2021-07-15 DIAGNOSIS — Z87891 Personal history of nicotine dependence: Secondary | ICD-10-CM | POA: Diagnosis not present

## 2021-07-15 DIAGNOSIS — N8003 Adenomyosis of the uterus: Secondary | ICD-10-CM | POA: Diagnosis not present

## 2021-07-15 DIAGNOSIS — R5383 Other fatigue: Secondary | ICD-10-CM | POA: Insufficient documentation

## 2021-07-15 DIAGNOSIS — R102 Pelvic and perineal pain: Secondary | ICD-10-CM | POA: Insufficient documentation

## 2021-07-15 DIAGNOSIS — R1084 Generalized abdominal pain: Secondary | ICD-10-CM | POA: Diagnosis not present

## 2021-07-15 DIAGNOSIS — D251 Intramural leiomyoma of uterus: Secondary | ICD-10-CM | POA: Diagnosis not present

## 2021-07-15 DIAGNOSIS — I10 Essential (primary) hypertension: Secondary | ICD-10-CM | POA: Diagnosis not present

## 2021-07-15 DIAGNOSIS — R079 Chest pain, unspecified: Secondary | ICD-10-CM | POA: Diagnosis not present

## 2021-07-15 HISTORY — DX: Chest pain, unspecified: R07.9

## 2021-07-15 LAB — I-STAT CHEM 8, ED
BUN: 7 mg/dL (ref 6–20)
Calcium, Ion: 1.24 mmol/L (ref 1.15–1.40)
Chloride: 109 mmol/L (ref 98–111)
Creatinine, Ser: 0.5 mg/dL (ref 0.44–1.00)
Glucose, Bld: 109 mg/dL — ABNORMAL HIGH (ref 70–99)
HCT: 36 % (ref 36.0–46.0)
Hemoglobin: 12.2 g/dL (ref 12.0–15.0)
Potassium: 3.7 mmol/L (ref 3.5–5.1)
Sodium: 142 mmol/L (ref 135–145)
TCO2: 22 mmol/L (ref 22–32)

## 2021-07-15 LAB — COMPREHENSIVE METABOLIC PANEL
ALT: 27 U/L (ref 0–44)
AST: 19 U/L (ref 15–41)
Albumin: 3.4 g/dL — ABNORMAL LOW (ref 3.5–5.0)
Alkaline Phosphatase: 46 U/L (ref 38–126)
Anion gap: 7 (ref 5–15)
BUN: 8 mg/dL (ref 6–20)
CO2: 22 mmol/L (ref 22–32)
Calcium: 9.1 mg/dL (ref 8.9–10.3)
Chloride: 112 mmol/L — ABNORMAL HIGH (ref 98–111)
Creatinine, Ser: 0.65 mg/dL (ref 0.44–1.00)
GFR, Estimated: >60 mL/min (ref >60)
Glucose, Bld: 104 mg/dL — ABNORMAL HIGH (ref 70–99)
Potassium: 4.1 mmol/L (ref 3.5–5.1)
Sodium: 141 mmol/L (ref 135–145)
Total Bilirubin: 0.2 mg/dL — ABNORMAL LOW (ref 0.3–1.2)
Total Protein: 6 g/dL — ABNORMAL LOW (ref 6.5–8.1)

## 2021-07-15 LAB — CBC WITH DIFFERENTIAL/PLATELET
Abs Immature Granulocytes: 0.02 10*3/uL (ref 0.00–0.07)
Basophils Absolute: 0 10*3/uL (ref 0.0–0.1)
Basophils Relative: 1 %
Eosinophils Absolute: 0.1 10*3/uL (ref 0.0–0.5)
Eosinophils Relative: 1 %
HCT: 34.5 % — ABNORMAL LOW (ref 36.0–46.0)
Hemoglobin: 10.8 g/dL — ABNORMAL LOW (ref 12.0–15.0)
Immature Granulocytes: 1 %
Lymphocytes Relative: 32 %
Lymphs Abs: 1.4 10*3/uL (ref 0.7–4.0)
MCH: 28.7 pg (ref 26.0–34.0)
MCHC: 31.3 g/dL (ref 30.0–36.0)
MCV: 91.8 fL (ref 80.0–100.0)
Monocytes Absolute: 0.4 10*3/uL (ref 0.1–1.0)
Monocytes Relative: 9 %
Neutro Abs: 2.5 10*3/uL (ref 1.7–7.7)
Neutrophils Relative %: 56 %
Platelets: 259 10*3/uL (ref 150–400)
RBC: 3.76 MIL/uL — ABNORMAL LOW (ref 3.87–5.11)
RDW: 13.6 % (ref 11.5–15.5)
WBC: 4.4 10*3/uL (ref 4.0–10.5)
nRBC: 0 % (ref 0.0–0.2)

## 2021-07-15 LAB — TROPONIN I (HIGH SENSITIVITY)
Troponin I (High Sensitivity): 3 ng/L (ref <18)
Troponin I (High Sensitivity): <2 ng/L (ref <18)

## 2021-07-15 LAB — WET PREP, GENITAL
Clue Cells Wet Prep HPF POC: NONE SEEN
Sperm: NONE SEEN
Trich, Wet Prep: NONE SEEN
WBC, Wet Prep HPF POC: NONE SEEN
Yeast Wet Prep HPF POC: NONE SEEN

## 2021-07-15 LAB — I-STAT BETA HCG BLOOD, ED (MC, WL, AP ONLY): I-stat hCG, quantitative: <5 m[IU]/mL (ref <5)

## 2021-07-15 LAB — HEMOGLOBIN AND HEMATOCRIT, BLOOD
HCT: 33 % — ABNORMAL LOW (ref 36.0–46.0)
Hemoglobin: 10.4 g/dL — ABNORMAL LOW (ref 12.0–15.0)

## 2021-07-15 MED ORDER — MORPHINE SULFATE (PF) 4 MG/ML IV SOLN
4.0000 mg | Freq: Once | INTRAVENOUS | Status: AC
  Administered 2021-07-15: 4 mg via INTRAVENOUS
  Filled 2021-07-15: qty 1

## 2021-07-15 MED ORDER — OXYCODONE-ACETAMINOPHEN 5-325 MG PO TABS: 1.0000 | ORAL_TABLET | ORAL | 0 refills | Status: DC | PRN

## 2021-07-15 MED ORDER — STERILE WATER FOR INJECTION IJ SOLN
INTRAMUSCULAR | Status: AC
  Administered 2021-07-15: 5 mL
  Filled 2021-07-15: qty 10

## 2021-07-15 MED ORDER — HYDROMORPHONE HCL 1 MG/ML IJ SOLN
1.0000 mg | Freq: Once | INTRAMUSCULAR | Status: AC
Start: 2021-07-15 — End: 2021-07-15
  Administered 2021-07-15: 1 mg via INTRAVENOUS
  Filled 2021-07-15: qty 1

## 2021-07-15 MED ORDER — ONDANSETRON HCL 4 MG PO TABS: 4.0000 mg | ORAL_TABLET | Freq: Three times a day (TID) | ORAL | 0 refills | Status: AC | PRN

## 2021-07-15 MED ORDER — ESTROGENS CONJUGATED 25 MG IJ SOLR
25.0000 mg | Freq: Once | INTRAMUSCULAR | Status: AC
  Administered 2021-07-15: 25 mg via INTRAVENOUS
  Filled 2021-07-15: qty 25

## 2021-07-15 NOTE — ED Notes (Signed)
Pt transported to ultrasound.

## 2021-07-15 NOTE — Discharge Instructions (Signed)
Your work-up today revealed your fibroid uterus is likely causing your discomfort and continued bleeding.  Your hemoglobin was slightly decreased but overall relatively stable despite the continued bleeding.  We spoke with OB/GYN who recommended the medication we provided and continuing the Megace at home.  They will help arrange closer follow-up.  Please use the pain and nausea medicine help with symptoms and maintain hydration.  If any symptoms change or worsen acutely, please return to the nearest emergency department.

## 2021-07-15 NOTE — ED Notes (Signed)
Pt verbalizes understanding of discharge instructions; opportunity for questions provided

## 2021-07-15 NOTE — ED Notes (Signed)
Patient transported to Ultrasound 

## 2021-07-15 NOTE — ED Provider Notes (Signed)
[facility] EMERGENCY DEPARTMENT Provider Note   CSN: [id number] Arrival date & time: 07/15/21  2706     History Chief Complaint  Patient presents with   Vaginal Bleeding    Sydney Cobb is a 45 y.o. female.  The history is provided by the patient and medical records. No language interpreter was used.  Vaginal Bleeding Quality:  Clots, dark red and heavier than menses Severity:  Severe Onset quality:  Gradual Duration:  5 months Timing:  Constant Progression:  Worsening Chronicity:  Chronic Possible pregnancy: no   Relieved by:  Nothing Worsened by:  Nothing Ineffective treatments:  Prescription medications Associated symptoms: abdominal pain, fatigue and nausea   Associated symptoms: no back pain, no dizziness, no dysuria, no fever and no vaginal discharge       History reviewed. No pertinent past medical history.  Patient Active Problem List   Diagnosis Date Noted   Abnormal uterine bleeding (AUB) 06/03/2021   Paresthesia of both hands 06/03/2021   Vaginal spotting 05/16/2021   Excessive bleeding in premenopausal period 03/31/2021   Lumbar degenerative disc disease 08/03/2020   Body mass index (BMI) 40.0-44.9, adult ([place]) 07/01/2020   Elevated blood-pressure reading, without diagnosis of hypertension 07/01/2020   Lumbar stenosis without neurogenic claudication 07/01/2020   Microcytic anemia 05/28/2020   NSAID long-term use 05/28/2020   Chronic right shoulder pain 09/30/2019   Left knee pain 09/30/2019   Back pain 09/30/2019   History of depression 09/30/2019    Past Surgical History:  Procedure Laterality Date   BREAST REDUCTION SURGERY     HAND SURGERY Right    REDUCTION MAMMAPLASTY Bilateral 2014   TUBAL LIGATION       OB History   No obstetric history on file.     Family History  Problem Relation Age of Onset   Breast cancer Mother     Social History   Tobacco Use   Smoking status: Former   Smokeless  tobacco: Never  Scientific laboratory technician Use: Never used  Substance Use Topics   Alcohol use: Never   Drug use: Never    Home Medications Prior to Admission medications   Medication Sig Start Date End Date Taking? Authorizing Provider  DULoxetine (CYMBALTA) 30 MG capsule TAKE 1 CAPSULE(30 MG) BY MOUTH DAILY 07/08/21   Lurline Del, DO  ibuprofen (ADVIL) 600 MG tablet Take 1 tablet (600 mg total) by mouth every 8 (eight) hours as needed. 06/01/21   Carollee Leitz, MD  megestrol (MEGACE) 20 MG tablet TAKE 2 TABLETS(40 MG) BY MOUTH DAILY 06/16/21   Lurline Del, DO  Multiple Vitamin (MULTIVITAMIN) capsule Take 1 capsule by mouth daily.    [provider]  pantoprazole (PROTONIX) 40 MG tablet Take 1 tablet (40 mg total) by mouth daily. 05/28/20 10/13/20  Meccariello, Bernita Raisin, DO  potassium chloride (KLOR-CON) 10 MEQ tablet Take 1 tablet (10 mEq total) by mouth daily. 03/13/20 10/13/20  Garald Balding, PA-C    Allergies    Patient has no known allergies.  Review of Systems   Review of Systems  Constitutional:  Positive for fatigue. Negative for chills and fever.  HENT:  Negative for congestion.   Eyes:  Negative for visual disturbance.  Respiratory:  Positive for chest tightness and shortness of breath. Negative for cough and wheezing.   Cardiovascular:  Positive for chest pain. Negative for palpitations and leg swelling.  Gastrointestinal:  Positive for abdominal pain and nausea. Negative for abdominal distention,  anal bleeding, blood in stool, constipation, diarrhea and vomiting.  Genitourinary:  Positive for vaginal bleeding. Negative for dysuria, flank pain, pelvic pain, urgency, vaginal discharge and vaginal pain.  Musculoskeletal:  Negative for back pain, neck pain and neck stiffness.  Skin:  Negative for rash and wound.  Neurological:  Positive for light-headedness. Negative for dizziness, weakness and headaches.  Psychiatric/Behavioral:  Negative for agitation and confusion.    All other systems reviewed and are negative.  Physical Exam Updated Vital Signs BP (!) 143/82 (BP Location: Left Arm)   Pulse 81   Temp 97.8 F (36.6 C) (Oral)   Resp 17   LMP  (LMP Unknown)   SpO2 100%   Physical Exam Vitals and nursing note reviewed. Exam conducted with a chaperone present.  Constitutional:      General: She is not in acute distress.    Appearance: She is well-developed. She is not ill-appearing, toxic-appearing or diaphoretic.  HENT:     Head: Normocephalic and atraumatic.     Nose: No congestion or rhinorrhea.     Mouth/Throat:     Mouth: Mucous membranes are moist.     Pharynx: No oropharyngeal exudate or posterior oropharyngeal erythema.  Eyes:     Extraocular Movements: Extraocular movements intact.     Conjunctiva/sclera: Conjunctivae normal.     Pupils: Pupils are equal, round, and reactive to light.  Cardiovascular:     Rate and Rhythm: Normal rate and regular rhythm.     Pulses: Normal pulses.     Heart sounds: No murmur heard. Pulmonary:     Effort: Pulmonary effort is normal. No respiratory distress.     Breath sounds: Normal breath sounds. No wheezing, rhonchi or rales.  Chest:     Chest wall: No tenderness.  Abdominal:     Palpations: Abdomen is soft.     Tenderness: There is abdominal tenderness. There is no guarding or rebound.     Hernia: No hernia is present.  Genitourinary:    Vagina: Bleeding present.     Cervix: Cervical bleeding present.     Uterus: Tender.      Adnexa:        Right: No tenderness.         Left: No tenderness.       Comments:  Tenderness of the uterus and multiple clots coming from the cervical os.  No discharge seen. Musculoskeletal:     Cervical back: Neck supple. No tenderness.     Right lower leg: No edema.     Left lower leg: No edema.  Skin:    General: Skin is warm and dry.     Capillary Refill: Capillary refill takes less than 2 seconds.     Findings: No erythema or rash.  Neurological:      General: No focal deficit present.     Mental Status: She is alert.  Psychiatric:        Mood and Affect: Mood normal.    ED Results / Procedures / Treatments   Labs (all labs ordered are listed, but only abnormal results are displayed) Labs Reviewed  COMPREHENSIVE METABOLIC PANEL - Abnormal; Notable for the following components:      Result Value   Chloride 112 (*)    Glucose, Bld 104 (*)    Total Protein 6.0 (*)    Albumin 3.4 (*)    Total Bilirubin 0.2 (*)    All other components within normal limits  CBC WITH DIFFERENTIAL/PLATELET - Abnormal; Notable for  the following components:   RBC 3.76 (*)    Hemoglobin 10.8 (*)    HCT 34.5 (*)    All other components within normal limits  HEMOGLOBIN AND HEMATOCRIT, BLOOD - Abnormal; Notable for the following components:   Hemoglobin 10.4 (*)    HCT 33.0 (*)    All other components within normal limits  I-STAT CHEM 8, ED - Abnormal; Notable for the following components:   Glucose, Bld 109 (*)    All other components within normal limits  WET PREP, GENITAL  I-STAT BETA HCG BLOOD, ED ([facility], [facility], [facility] ONLY)  GC/CHLAMYDIA PROBE AMP () NOT AT [facility]  TROPONIN I (HIGH SENSITIVITY)  TROPONIN I (HIGH SENSITIVITY)    EKG EKG Interpretation  Date/Time:  Thursday July 15 2021 09:51:03 EDT Ventricular Rate:  71 PR Interval:  143 QRS Duration: 101 QT Interval:  395 QTC Calculation: 430 R Axis:   20 Text Interpretation: Sinus rhythm Low voltage, precordial leads When compared to prior, similar appearance. No STEMI Confirmed by Antony Blackbird [phone]) on 07/15/2021 11:18:32 AM  Radiology DG Chest 2 View  Result Date: 07/15/2021 CLINICAL DATA:  Chest pain. EXAM: CHEST - 2 VIEW COMPARISON:  03/13/2020 FINDINGS: The cardiac silhouette, mediastinal and hilar contours are normal. The lungs are clear. No pleural effusions or pulmonary lesions. IMPRESSION: No acute cardiopulmonary findings. Electronically Signed   By: Marijo Sanes M.D.    On: 07/15/2021 09:49   US PELVIC COMPLETE W TRANSVAGINAL AND TORSION R/O  Result Date: 07/15/2021 CLINICAL DATA:  45 year old female with severe vaginal bleeding, worsening, with lower abdominal pain. LMP 03/03/2021, on Depo-Provera for 4 months. EXAM: TRANSABDOMINAL AND TRANSVAGINAL ULTRASOUND OF PELVIS DOPPLER ULTRASOUND OF OVARIES TECHNIQUE: Both transabdominal and transvaginal ultrasound examinations of the pelvis were performed. Transabdominal technique was performed for global imaging of the pelvis including uterus, ovaries, adnexal regions, and pelvic cul-de-sac. It was necessary to proceed with endovaginal exam following the transabdominal exam to visualize the endometrium and adnexa. Color and duplex Doppler ultrasound was utilized to evaluate blood flow to the ovaries. COMPARISON:  06/15/2021 pelvic sonogram. FINDINGS: Uterus Measurements: 12.3 x 6.5 x 7.2 cm = volume: 299 mL. Enlarged anteverted globular uterus with diffuse myometrial refractory shadowing and indistinct endometrial-myometrial interface, compatible with diffuse uterine adenomyosis. Intramural posterior right uterine body 3.3 x 2.9 x 2.6 cm fibroid. Endometrium Thickness: 6 mm. Poorly visualized endometrium. No endometrial cavity fluid or focal endometrial mass. Right ovary Nonvisualization of the right ovary.  No right adnexal mass. Left ovary Nonvisualization of the left ovary.  No left adnexal masses. Other findings No abnormal free fluid. IMPRESSION: 1. Diffuse uterine adenomyosis. Intramural 3.3 cm posterior right uterine body fibroid. 2. Poorly visualized endometrium, estimated bilayer endometrial thickness 6 mm. No endometrial cavity fluid or focal endometrial mass. If bleeding remains unresponsive to hormonal or medical therapy, sonohysterogram should be considered for focal lesion work-up. (Ref: Radiological Reasoning: Algorithmic Workup of Abnormal Vaginal Bleeding with Endovaginal Sonography and Sonohysterography. AJR 2008;  387:F64-33). 3. Nonvisualization of the ovaries bilaterally. No adnexal masses. No free fluid. Electronically Signed   By: Ilona Sorrel M.D.   On: 07/15/2021 13:36    Procedures Procedures   Medications Ordered in ED Medications  morphine 4 MG/ML injection 4 mg (4 mg Intravenous Given 07/15/21 0951)  morphine 4 MG/ML injection 4 mg (4 mg Intravenous Given 07/15/21 1112)  HYDROmorphone (DILAUDID) injection 1 mg (1 mg Intravenous Given 07/15/21 1413)  conjugated estrogens (PREMARIN) injection 25 mg (25 mg Intravenous  Given 07/15/21 1442)  sterile water (preservative free) injection (5 mLs  Given 07/15/21 1443)    ED Course  I have reviewed the triage vital signs and the nursing notes.  Pertinent labs & imaging results that were available during my care of the patient were reviewed by me and considered in my medical decision making (see chart for details).    MDM Rules/Calculators/A&P                           Sydney Cobb is a 45 y.o. female with a past medical history significant for previous tubal ligation, hypertension, and several months of abnormal uterine bleeding who presents with worsened abnormal uterine bleeding, fatigue, chest tightness, shortness of breath, and abdominal discomfort.  Patient reports that for several months he has been having this vaginal bleeding and she has been on Megace to help treat it.  She reports that several weeks ago they doubled it up to 40 mg daily.  She reports that the bleeding has been persistent over the last week but then overnight started worsening.  She reports that she has been going through about 4 large pads a day but this morning has been having large clots coming out and she is now having abdominal discomfort at a 5 out of 10 in the lower abdomen and over the last several days had some more fatigue, some chest tightness, and some shortness of breath with it.  She denies any fevers, chills congestion, cough.  Denies any constipation,  diarrhea, or dysuria.  Denies any vaginal discharge.  She denies any trauma.  She reports that she is not scheduled to see her OB/GYN until next month and is concerned about the escalating rate of bleeding and her new systemic symptoms that she is worsening.  On exam, lungs clear and chest nontender.  Lower abdomen was slightly tender to palpation.  Back was nontender.  No rashes seen.  Pulses in all extremities present.  Vital signs showed mild hypertension but otherwise reassuring.  Clinically I am concerned about the patient having escalating abnormal bleeding despite her Megace therapy.  We will do a pelvic exam with a chaperone to look for other causes of bleeding and also get a pelvic ultrasound given the new tenderness and pain.  We will get labs including troponin given the chest tightness as well as a chest x-ray and EKG.  EKG did not show STEMI.  Anticipate touching base with either OB/GYN or family medicine to discuss disposition when work-up is completed.  10:18 AM Pelvic exam was performed with a chaperone and she did have multiple large clots coming out of her vagina and from the cervical os.  She did have tenderness on the uterus but denies tenderness on the ovaries.  We will get the pelvic ultrasound and likely touch base with OB/GYN for recommendations.  Her hemoglobin did drop slightly to 10.8 from 11.5 previously.  We will see what her troponin and metabolic panel show.  With the tenderness she had, we did get GC/chlamydia swab and wet prep.  Chest x-ray reassuring.   Patient was given morphine for pain control which helped her discomfort.  2:32 PM Patient's pain improves after pain medicine.  I spoke with OB/GYN who recommends a dose of 25 mg IV Premarin to help with the bleeding and she needs to continue her Megace.  They will help arrange her to have sooner follow-up than next month.  They agree with  getting a repeat hemoglobin given the continued bleeding and if it is not  drastically lower, patient be discharged home with prescription for pain medicine and a closer follow-up.  Patient agrees so we will wait on her repeat hemoglobin to return.  anticipate discharge.  2:55 PM Repeat hemoglobin returned still in the 10 range at 10.4.  Agrees to discharge home.  Will give prescription for pain medicine and nausea medicine and she will have the closer follow-up as we discussed.  She understands return precautions and was discharged in good condition.  Final Clinical Impression(s) / ED Diagnoses Final diagnoses:  Vaginal bleeding  Generalized abdominal pain    Rx / DC Orders ED Discharge Orders          Ordered    oxyCODONE-acetaminophen (PERCOCET/ROXICET) 5-325 MG tablet  Every 4 hours PRN        07/15/21 1458    ondansetron (ZOFRAN) 4 MG tablet  Every 8 hours PRN        07/15/21 1458           Clinical Impression: 1. Vaginal bleeding   2. Pelvic pain   3. Generalized abdominal pain     Disposition: Admit  This note was prepared with assistance of Dragon voice recognition software. Occasional wrong-word or sound-a-like substitutions may have occurred due to the inherent limitations of voice recognition software.     Karren Newland, Gwenyth Allegra, MD 07/15/21 1459

## 2021-07-15 NOTE — ED Triage Notes (Addendum)
Pt reports vaginal bleeding X4 months with worsening today. Denies chest pain, shob or dizziness.

## 2021-07-16 LAB — GC/CHLAMYDIA PROBE AMP (~~LOC~~) NOT AT ARMC
Chlamydia: NEGATIVE
Comment: NEGATIVE
Comment: NORMAL
Neisseria Gonorrhea: NEGATIVE

## 2021-07-23 DIAGNOSIS — N926 Irregular menstruation, unspecified: Secondary | ICD-10-CM | POA: Diagnosis not present

## 2021-07-23 DIAGNOSIS — M539 Dorsopathy, unspecified: Secondary | ICD-10-CM | POA: Diagnosis not present

## 2021-07-23 DIAGNOSIS — Z6837 Body mass index (BMI) 37.0-37.9, adult: Secondary | ICD-10-CM | POA: Diagnosis not present

## 2021-07-23 DIAGNOSIS — Z79899 Other long term (current) drug therapy: Secondary | ICD-10-CM | POA: Diagnosis not present

## 2021-07-23 DIAGNOSIS — M47816 Spondylosis without myelopathy or radiculopathy, lumbar region: Secondary | ICD-10-CM | POA: Diagnosis not present

## 2021-07-27 ENCOUNTER — Other Ambulatory Visit: Payer: Self-pay

## 2021-07-27 ENCOUNTER — Ambulatory Visit (INDEPENDENT_AMBULATORY_CARE_PROVIDER_SITE_OTHER): Payer: Medicare HMO | Admitting: Family Medicine

## 2021-07-27 ENCOUNTER — Encounter: Payer: Self-pay | Admitting: Family Medicine

## 2021-07-27 VITALS — BP 120/80 | HR 60 | Ht 64.0 in | Wt 211.8 lb

## 2021-07-27 DIAGNOSIS — N939 Abnormal uterine and vaginal bleeding, unspecified: Secondary | ICD-10-CM | POA: Diagnosis not present

## 2021-07-27 DIAGNOSIS — R202 Paresthesia of skin: Secondary | ICD-10-CM

## 2021-07-27 MED ORDER — DULOXETINE HCL 60 MG PO CPEP: 60.0000 mg | ORAL_CAPSULE | Freq: Every day | ORAL | 1 refills | Status: DC

## 2021-07-27 NOTE — Patient Instructions (Addendum)
Thank you for coming to see me today. It was a pleasure.   Follow up with GYNE appointment tomorrow   Call [facility] [phone] to schedule an appointment for your colonoscopy  Please follow-up with PCP as needed  If you have any questions or concerns, please do not hesitate to call the office at [phone].  Best,   Carollee Leitz, MD

## 2021-07-27 NOTE — Progress Notes (Signed)
    SUBJECTIVE:   CHIEF COMPLAINT / HPI:   AUB Seen in ED 10/13 for AUB, Gyne consulted at that time and recommended Provera IV, continue Megace and early outpatient follow up. She has appointment schedules tomorrow with Dr Roselie Awkward, OB/GYNE. Would like me to schedule hysterectomy.     Bilateral hand paraesthesia Continues.  Left worse than right.  Noticeable upon awakening and mildly relieved when standing and letting arms hang by side with little shaking.  Numbness starts at wrist and radiates predominately down ulnar side toward fingers.  Cymbalta helps a little.   PERTINENT  PMH / PSH:  AUB RPR positive and treated  OBJECTIVE:   BP 120/80   Pulse 60   Ht 5\' 4"  (1.626 m)   Wt 211 lb 12.8 oz (96.1 kg)   LMP  (LMP Unknown) Comment: ongoing bleeding  SpO2 99%   BMI 36.36 kg/m    General: Alert, no acute distress Hand/Fingers/Wrist Inspection: no swelling, no bony deformity or atrophy of the hypothenar region  Palpation: nontender to palpation of DIP, PIP, MTP, scaphoid/snuff box, scaphoid tubercle  AROM/PROM: Full flexion, extension, supination, pronation  Strength: 5/5 flexion, 5/5 extension, 5/5 pronation, 5/5 supination Special tests: (-) [facility] grind, (-) [place], (-) Tinel at wrist, (-) Ph[place]    ASSESSMENT/PLAN:   Abnormal uterine bleeding (AUB) Follow up with Dr Roselie Awkward tomorrow Follow up with PCP as needed  Paresthesia of both hands Increase Cymbalta 60 mg daily If no changes in symptoms can consider steroid injection, Nerve conduction studies Recommend wrist splint at night Follow up with PCP if symptoms do not improve     Carollee Leitz, MD [place]

## 2021-07-28 ENCOUNTER — Ambulatory Visit (INDEPENDENT_AMBULATORY_CARE_PROVIDER_SITE_OTHER): Payer: Medicare HMO | Admitting: Obstetrics & Gynecology

## 2021-07-28 ENCOUNTER — Encounter: Payer: Self-pay | Admitting: Obstetrics & Gynecology

## 2021-07-28 VITALS — BP 132/79 | HR 85 | Ht 64.0 in | Wt 229.0 lb

## 2021-07-28 DIAGNOSIS — N939 Abnormal uterine and vaginal bleeding, unspecified: Secondary | ICD-10-CM

## 2021-07-28 DIAGNOSIS — N8003 Adenomyosis of the uterus: Secondary | ICD-10-CM | POA: Diagnosis not present

## 2021-07-28 DIAGNOSIS — D251 Intramural leiomyoma of uterus: Secondary | ICD-10-CM | POA: Diagnosis not present

## 2021-07-28 MED ORDER — MEGESTROL ACETATE 40 MG PO TABS
40.0000 mg | ORAL_TABLET | Freq: Two times a day (BID) | ORAL | 2 refills | Status: AC
Start: 2021-07-28 — End: ?

## 2021-07-28 MED ORDER — LEUPROLIDE ACETATE (3 MONTH) 11.25 MG IM KIT: 11.2500 mg | PACK | Freq: Once | INTRAMUSCULAR | Status: DC

## 2021-07-28 NOTE — Progress Notes (Signed)
Pt presents today for AUB. LMP: 04/29/21. Pt states bleeding has been heavy since starting in July. Pt states she is wearing overnight pads and changing 3-4 times a day. States it is painful and she is passing golfball size clots every time she changes pads. Last pap: 03/2021 WNL. Pt has had multiple visits to PCP for abnormal bleeding. U/S done on 07/15/21.

## 2021-07-28 NOTE — Progress Notes (Signed)
Patient ID: Sydney Cobb, female   DOB: 03/21/1976, 45 y.o.   MRN: [id number]  Chief Complaint  Patient presents with   Menstrual Problem    HPI Sydney Cobb is a 45 y.o. female.  [id number] Patient's last menstrual period was 04/29/2021 (exact date). She has heavy menses with clots and had a ED visit 07/15/21 due to bleeding and pain. She would like a hysterectomy. Megace did not stop her bleeding and Premarin IV in the ED did not help. S/P BTL HPI  No past medical history on file.  Past Surgical History:  Procedure Laterality Date   BREAST REDUCTION SURGERY     HAND SURGERY Right    REDUCTION MAMMAPLASTY Bilateral 2014   TUBAL LIGATION      Family History  Problem Relation Age of Onset   Hypertension Father    Parkinson's disease Father    Breast cancer Mother 33       deceased at 65    Social History Social History   Tobacco Use   Smoking status: Former   Smokeless tobacco: Never  Scientific laboratory technician Use: Never used  Substance Use Topics   Alcohol use: Never   Drug use: Never    No Known Allergies  Current Outpatient Medications  Medication Sig Dispense Refill   Ascorbic Acid (VITAMIN C PO) Take 1 tablet by mouth daily.     Camphor-Menthol-Methyl Sal (SALONPAS EX) Apply 1 patch topically daily as needed (pain).     DULoxetine (CYMBALTA) 60 MG capsule Take 1 capsule (60 mg total) by mouth daily. 30 capsule 1   Ferrous Sulfate (IRON PO) Take 1 tablet by mouth daily.     gabapentin (NEURONTIN) 300 MG capsule Take 300 mg by mouth 3 (three) times daily as needed (pain).     ibuprofen (ADVIL) 600 MG tablet Take 1 tablet (600 mg total) by mouth every 8 (eight) hours as needed. (Patient taking differently: Take 600 mg by mouth every 8 (eight) hours as needed for fever, headache or mild pain.) 30 tablet 0   ondansetron (ZOFRAN) 4 MG tablet Take 1 tablet (4 mg total) by mouth every 8 (eight) hours as needed for nausea or vomiting. 12 tablet 0    oxyCODONE-acetaminophen (PERCOCET) 10-325 MG tablet Take 1 tablet by mouth 3 (three) times daily as needed for pain.     VITAMIN D PO Take 1 tablet by mouth daily.     megestrol (MEGACE) 40 MG tablet Take 1 tablet (40 mg total) by mouth 2 (two) times daily. 60 tablet 2   Current Facility-Administered Medications  Medication Dose Route Frequency Provider Last Rate Last Admin   leuprolide (LUPRON) injection 11.25 mg  11.25 mg Intramuscular Once Woodroe Mode, MD        Review of Systems Review of Systems  Constitutional: Negative.   Gastrointestinal: Negative.   Genitourinary:  Positive for menstrual problem, pelvic pain and vaginal bleeding.   Blood pressure 132/79, pulse 85, height 5\' 4"  (1.626 m), weight 229 lb (103.9 kg), last menstrual period 04/29/2021.  Physical Exam Physical Exam Vitals and nursing note reviewed. Exam conducted with a chaperone present.  Pulmonary:     Effort: Pulmonary effort is normal.  Genitourinary:    General: Normal vulva.     Vagina: Normal.     Cervix: Normal.     Uterus: Normal.      Adnexa: Right adnexa normal and left adnexa normal.  Psychiatric:  Mood and Affect: Mood normal.        Behavior: Behavior normal.     Narrative & Impression  CLINICAL DATA:  Abnormal uterine bleeding   EXAM: TRANSABDOMINAL AND TRANSVAGINAL ULTRASOUND OF PELVIS   TECHNIQUE: Both transabdominal and transvaginal ultrasound examinations of the pelvis were performed. Transabdominal technique was performed for global imaging of the pelvis including uterus, ovaries, adnexal regions, and pelvic cul-de-sac. It was necessary to proceed with endovaginal exam following the transabdominal exam to visualize the uterus endometrium ovaries.   COMPARISON:  None   FINDINGS: Uterus   Measurements: 14.4 x 7.3 x 6.4 cm = volume: 672.8 mL. Heterogenous echotexture with areas of shadowing at the fundus. Poorly defined right uterine corpus myometrial mass measuring  2.9 x 3.1 x 2.8 cm.   Endometrium   Thickness: 16 mm.  No focal abnormality visualized.   Right ovary   Not seen   Left ovary   Measurements: 2.6 x 1.4 x 2.8 cm = volume: 10.2 mL. Normal appearance/no adnexal mass.   Other findings   No abnormal free fluid.   IMPRESSION: 1. Heterogeneous myometrial echotexture with prominent fundus suggestive of adenomyosis. More focal hypoechoic region in the right uterine corpus measuring 3.1 cm could reflect fibroid or focal adenomyoma. 2. Endometrial thickness of 16 mm. If bleeding remains unresponsive to hormonal or medical therapy, focal lesion work-up with sonohysterogram should be considered. Endometrial biopsy should also be considered in pre-menopausal patients at high risk for endometrial carcinoma. (Ref: Radiological Reasoning: Algorithmic Workup of Abnormal Vaginal Bleeding with Endovaginal Sonography and Sonohysterography. AJR 2008; [id number]:F00-71) 3. Nonvisualized right ovary     Electronically Signed   By: Donavan Foil M.D.   On: 06/16/2021 19:10    CBC    Component Value Date/Time   WBC 4.4 07/15/2021 0844   RBC 3.76 (L) 07/15/2021 0844   HGB 10.4 (L) 07/15/2021 1354   HGB 11.5 06/01/2021 1613   HCT 33.0 (L) 07/15/2021 1354   HCT 35.8 06/01/2021 1613   PLT 259 07/15/2021 0844   PLT 274 06/01/2021 1613   MCV 91.8 07/15/2021 0844   MCV 87 06/01/2021 1613   MCH 28.7 07/15/2021 0844   MCHC 31.3 07/15/2021 0844   RDW 13.6 07/15/2021 0844   RDW 13.2 06/01/2021 1613   LYMPHSABS 1.4 07/15/2021 0844   MONOABS 0.4 07/15/2021 0844   EOSABS 0.1 07/15/2021 0844   BASOSABS 0.0 07/15/2021 0844      Assessment Abnormal uterine bleeding (AUB) - Plan: leuprolide (LUPRON) injection 11.25 mg, CBC, FSH  Adenomyosis - Plan: leuprolide (LUPRON) injection 11.25 mg  Intramural leiomyoma of uterus - Plan: leuprolide (LUPRON) injection 11.25 mg  Abnormal uterine bleeding - Plan: megestrol (MEGACE) 40 MG  tablet   Plan Lupron ASAP, considering [facility]. RTC 4 weeks    Emeterio Reeve 07/28/2021, 4:08 PM

## 2021-07-29 LAB — CBC
Hematocrit: 34.3 % (ref 34.0–46.6)
Hemoglobin: 11.2 g/dL (ref 11.1–15.9)
MCH: 28.9 pg (ref 26.6–33.0)
MCHC: 32.7 g/dL (ref 31.5–35.7)
MCV: 89 fL (ref 79–97)
Platelets: 309 10*3/uL (ref 150–450)
RBC: 3.87 x10E6/uL (ref 3.77–5.28)
RDW: 12.9 % (ref 11.7–15.4)
WBC: 5.7 10*3/uL (ref 3.4–10.8)

## 2021-07-29 LAB — FOLLICLE STIMULATING HORMONE: FSH: 7.7 m[IU]/mL

## 2021-08-02 ENCOUNTER — Other Ambulatory Visit: Payer: Self-pay

## 2021-08-02 ENCOUNTER — Encounter: Payer: Self-pay | Admitting: Family Medicine

## 2021-08-02 NOTE — Assessment & Plan Note (Signed)
Increase Cymbalta 60 mg daily If no changes in symptoms can consider steroid injection, Nerve conduction studies Recommend wrist splint at night Follow up with PCP if symptoms do not improve

## 2021-08-02 NOTE — Assessment & Plan Note (Signed)
Follow up with Dr Roselie Awkward tomorrow Follow up with PCP as needed

## 2021-08-03 ENCOUNTER — Ambulatory Visit: Payer: Medicare HMO | Admitting: Obstetrics and Gynecology

## 2021-08-03 MED ORDER — GABAPENTIN 300 MG PO CAPS: 300.0000 mg | ORAL_CAPSULE | Freq: Three times a day (TID) | ORAL | 1 refills | Status: DC | PRN

## 2021-08-22 DIAGNOSIS — Z79899 Other long term (current) drug therapy: Secondary | ICD-10-CM | POA: Diagnosis not present

## 2021-08-22 DIAGNOSIS — N926 Irregular menstruation, unspecified: Secondary | ICD-10-CM | POA: Diagnosis not present

## 2021-08-22 DIAGNOSIS — Z6837 Body mass index (BMI) 37.0-37.9, adult: Secondary | ICD-10-CM | POA: Diagnosis not present

## 2021-08-22 DIAGNOSIS — M47816 Spondylosis without myelopathy or radiculopathy, lumbar region: Secondary | ICD-10-CM | POA: Diagnosis not present

## 2021-08-22 DIAGNOSIS — M539 Dorsopathy, unspecified: Secondary | ICD-10-CM | POA: Diagnosis not present

## 2021-08-23 ENCOUNTER — Other Ambulatory Visit: Payer: Self-pay | Admitting: *Deleted

## 2021-08-23 NOTE — Progress Notes (Signed)
Prior Authorization Lupron completed and submitted at [email or web address]. Office visit notes uploaded as additional documentation.

## 2021-08-25 ENCOUNTER — Ambulatory Visit: Payer: Medicare HMO

## 2021-09-01 ENCOUNTER — Ambulatory Visit (INDEPENDENT_AMBULATORY_CARE_PROVIDER_SITE_OTHER): Payer: Medicare HMO

## 2021-09-01 ENCOUNTER — Other Ambulatory Visit: Payer: Self-pay

## 2021-09-01 ENCOUNTER — Encounter: Payer: Self-pay | Admitting: Gastroenterology

## 2021-09-01 VITALS — BP 167/96 | HR 118 | Ht 64.0 in | Wt 211.0 lb

## 2021-09-01 DIAGNOSIS — Z01812 Encounter for preprocedural laboratory examination: Secondary | ICD-10-CM

## 2021-09-01 DIAGNOSIS — D251 Intramural leiomyoma of uterus: Secondary | ICD-10-CM

## 2021-09-01 DIAGNOSIS — Z3202 Encounter for pregnancy test, result negative: Secondary | ICD-10-CM

## 2021-09-01 DIAGNOSIS — N8003 Adenomyosis of the uterus: Secondary | ICD-10-CM

## 2021-09-01 DIAGNOSIS — N939 Abnormal uterine and vaginal bleeding, unspecified: Secondary | ICD-10-CM | POA: Diagnosis not present

## 2021-09-01 LAB — POCT URINE PREGNANCY: Preg Test, Ur: NEGATIVE

## 2021-09-01 MED ORDER — LEUPROLIDE ACETATE (3 MONTH) 11.25 MG IM KIT
11.2500 mg | PACK | Freq: Once | INTRAMUSCULAR | Status: AC
  Administered 2021-09-01: 11.25 mg via INTRAMUSCULAR

## 2021-09-01 NOTE — Progress Notes (Signed)
Pt presents today for Depo Lupron injection. Lupron 11.25mg  given IM LUOQ without any complications. Pt tolerated well with no adverse side effects. Pt to return to office in 12 weeks for repeat injection. Pt scheduled f/u injection at checkout.

## 2021-09-09 ENCOUNTER — Telehealth: Payer: Self-pay | Admitting: Obstetrics & Gynecology

## 2021-09-09 NOTE — Telephone Encounter (Signed)
Patient called, she received her [place] Lupron injection on 09/02/21 and is now having heavy bleeding, passing clots, some dizziness and lower abdominal pain.   Will reach out to physician and follow back up with patient.  Instructed patient in the interim, if bleeding increased or she felt light headed to go to the ED for evaluation.   Routing to Dr. Roselie Awkward for advise/plan

## 2021-09-13 ENCOUNTER — Telehealth: Payer: Self-pay

## 2021-09-13 NOTE — Telephone Encounter (Signed)
Pt called stating that she is having several side effects from her Lupron shot. She is c/o HA, mood swings, hot flashes, swelling and SOB. States these started about 2 days after the injections. States she called previously but did not hear anything back from the office. Pt was out of work due to the HA, SOB, and swelling this past weekend. She states the bleeding is starting to decrease but just over the past day or so. She is concerned because the side effects are affecting her daily life. Please advise.

## 2021-09-14 ENCOUNTER — Other Ambulatory Visit: Payer: Self-pay | Admitting: Obstetrics & Gynecology

## 2021-09-14 DIAGNOSIS — D251 Intramural leiomyoma of uterus: Secondary | ICD-10-CM

## 2021-09-14 MED ORDER — ESTRADIOL 1 MG PO TABS: 1.0000 mg | ORAL_TABLET | Freq: Every day | ORAL | 2 refills | Status: AC

## 2021-09-14 NOTE — Telephone Encounter (Signed)
Per Dr. Roselie Awkward, patient can start estradiol 1mg  daily to help with side effects. Also she will need to take megace 40mg  BID until bleeding stops. He would like to see patient back for follow up before next injection and after taking the medications. Will schedule patient for after first of the year. Pt agreed and verbalized understanding.

## 2021-09-14 NOTE — Progress Notes (Signed)
Meds ordered this encounter  Medications   estradiol (ESTRACE) 1 MG tablet    Sig: Take 1 tablet (1 mg total) by mouth daily.    Dispense:  30 tablet    Refill:  2

## 2021-09-21 DIAGNOSIS — Z6837 Body mass index (BMI) 37.0-37.9, adult: Secondary | ICD-10-CM | POA: Diagnosis not present

## 2021-09-21 DIAGNOSIS — N926 Irregular menstruation, unspecified: Secondary | ICD-10-CM | POA: Diagnosis not present

## 2021-09-21 DIAGNOSIS — M47816 Spondylosis without myelopathy or radiculopathy, lumbar region: Secondary | ICD-10-CM | POA: Diagnosis not present

## 2021-09-21 DIAGNOSIS — E559 Vitamin D deficiency, unspecified: Secondary | ICD-10-CM | POA: Diagnosis not present

## 2021-09-21 DIAGNOSIS — Z79899 Other long term (current) drug therapy: Secondary | ICD-10-CM | POA: Diagnosis not present

## 2021-09-21 DIAGNOSIS — Z20822 Contact with and (suspected) exposure to covid-19: Secondary | ICD-10-CM | POA: Diagnosis not present

## 2021-09-21 DIAGNOSIS — M539 Dorsopathy, unspecified: Secondary | ICD-10-CM | POA: Diagnosis not present

## 2021-09-21 DIAGNOSIS — M129 Arthropathy, unspecified: Secondary | ICD-10-CM | POA: Diagnosis not present

## 2021-10-05 ENCOUNTER — Ambulatory Visit: Payer: Medicare HMO | Admitting: Family Medicine

## 2021-10-19 ENCOUNTER — Ambulatory Visit (AMBULATORY_SURGERY_CENTER): Payer: Medicare Other

## 2021-10-19 VITALS — Ht 64.0 in | Wt 198.0 lb

## 2021-10-19 DIAGNOSIS — Z1211 Encounter for screening for malignant neoplasm of colon: Secondary | ICD-10-CM

## 2021-10-19 MED ORDER — NA SULFATE-K SULFATE-MG SULF 17.5-3.13-1.6 GM/177ML PO SOLN: 1.0000 | Freq: Once | ORAL | 0 refills | Status: AC

## 2021-10-19 NOTE — Progress Notes (Signed)
No egg or soy allergy known to patient  No issues known to pt with past sedation with any surgeries or procedures Patient denies ever being told they had issues or difficulty with intubation  No FH of Malignant Hyperthermia Pt is not on diet pills Pt is not on  home 02  Pt is not on blood thinners  Pt denies issues with constipation  No A fib or A flutter  Pt is fully vaccinated  for Covid    NO PA's for preps discussed with pt In PV today  Discussed with pt there will be an out-of-pocket cost for prep and that varies from $0 to 70 +  dollars - pt verbalized understanding   Due to the COVID-19 pandemic we are asking patients to follow certain guidelines in PV and the [facility]   Pt aware of COVID protocols and [facility] guidelines   PV completed over the phone. Pt verified name, DOB, address and insurance during PV today.  Pt mailed instruction packet with copy of consent form to read and not return, and instructions.  Pt encouraged to call with questions or issues.   procedure instructions sent via My Chart

## 2021-10-26 ENCOUNTER — Other Ambulatory Visit: Payer: Self-pay

## 2021-10-26 ENCOUNTER — Ambulatory Visit (INDEPENDENT_AMBULATORY_CARE_PROVIDER_SITE_OTHER): Payer: Medicare Other | Admitting: Obstetrics & Gynecology

## 2021-10-26 ENCOUNTER — Encounter: Payer: Self-pay | Admitting: Gastroenterology

## 2021-10-26 VITALS — BP 150/95 | HR 93 | Wt 197.0 lb

## 2021-10-26 DIAGNOSIS — D251 Intramural leiomyoma of uterus: Secondary | ICD-10-CM | POA: Diagnosis not present

## 2021-10-26 DIAGNOSIS — N8003 Adenomyosis of the uterus: Secondary | ICD-10-CM | POA: Diagnosis not present

## 2021-10-26 DIAGNOSIS — N939 Abnormal uterine and vaginal bleeding, unspecified: Secondary | ICD-10-CM | POA: Diagnosis not present

## 2021-10-26 NOTE — Progress Notes (Signed)
Pt states that she took one Lupron injection- does not want continue. Pt states her bleeding has returned over the last few days.

## 2021-10-26 NOTE — Progress Notes (Signed)
Cc: vaginal bleeding, hot flushes  46 y.o. [id number] No LMP recorded. She began having bleeding after having Lupron in November, VMS. She wants definitive management for adenomysis and DUB. She is s/p BTL. Patient desires surgical management with [facility] BS.  The risks of surgery were discussed in detail with the patient including but not limited to: bleeding which may require transfusion or reoperation; infection which may require prolonged hospitalization or re-hospitalization and antibiotic therapy; injury to bowel, bladder, ureters and major vessels or other surrounding organs which may lead to other procedures; formation of adhesions; need for additional procedures including laparotomy or subsequent procedures secondary to intraoperative injury or abnormal pathology; thromboembolic phenomenon; incisional problems and other postoperative or anesthesia complications.  Patient was told that the likelihood that her condition and symptoms will be treated effectively with this surgical management was very high; the postoperative expectations were also discussed in detail. The patient also understands the alternative treatment options which were discussed in full. All questions were answered. She will return for endometrial biopsy and she will be contacted for scheduling for [facility].   Patient ID: Sydney Cobb, female   DOB: 12/29/1975, 46 y.o.   MRN: [id number]   Woodroe Mode, MD 10/26/2021

## 2021-10-29 ENCOUNTER — Telehealth: Payer: Self-pay | Admitting: Gastroenterology

## 2021-10-29 DIAGNOSIS — Z1211 Encounter for screening for malignant neoplasm of colon: Secondary | ICD-10-CM

## 2021-10-29 MED ORDER — NA SULFATE-K SULFATE-MG SULF 17.5-3.13-1.6 GM/177ML PO SOLN: 1.0000 | ORAL | 0 refills | Status: DC

## 2021-10-29 NOTE — Telephone Encounter (Signed)
Inbound call from patient stating pharmacy has not received script for prep medication and is requesting for it to be sent to [facility] on [address] please.

## 2021-10-29 NOTE — Telephone Encounter (Signed)
Suprep rx sent to [facility] per pt's request.

## 2021-11-02 ENCOUNTER — Ambulatory Visit (AMBULATORY_SURGERY_CENTER): Payer: Medicare Other | Admitting: Gastroenterology

## 2021-11-02 ENCOUNTER — Encounter: Payer: Self-pay | Admitting: Gastroenterology

## 2021-11-02 ENCOUNTER — Encounter (HOSPITAL_BASED_OUTPATIENT_CLINIC_OR_DEPARTMENT_OTHER): Payer: Self-pay | Admitting: Obstetrics & Gynecology

## 2021-11-02 VITALS — BP 116/89 | HR 72 | Temp 98.7°F | Resp 17 | Ht 64.0 in | Wt 198.0 lb

## 2021-11-02 DIAGNOSIS — D123 Benign neoplasm of transverse colon: Secondary | ICD-10-CM

## 2021-11-02 DIAGNOSIS — Z1211 Encounter for screening for malignant neoplasm of colon: Secondary | ICD-10-CM | POA: Diagnosis not present

## 2021-11-02 HISTORY — PX: COLONOSCOPY: SHX174

## 2021-11-02 MED ORDER — SODIUM CHLORIDE 0.9 % IV SOLN: 500.0000 mL | Freq: Once | INTRAVENOUS | Status: DC

## 2021-11-02 NOTE — Progress Notes (Signed)
VS-CW  Pt's states no medical or surgical changes since previsit or office visit.  

## 2021-11-02 NOTE — Progress Notes (Signed)
[facility] History and Physical   Primary Care Physician:  Lurline Del, DO   Reason for Procedure:  Colorectal cancer screening  Plan:    Screening colonoscopy with possible interventions as needed     HPI: Sydney Cobb is a very pleasant 46 y.o. female here for screening colonoscopy. Denies any nausea, vomiting, abdominal pain, melena or bright red blood per rectum  The risks and benefits as well as alternatives of endoscopic procedure(s) have been discussed and reviewed. All questions answered. The patient agrees to proceed.    Past Medical History:  Diagnosis Date   Anemia    Depression     Past Surgical History:  Procedure Laterality Date   HAND SURGERY Right    REDUCTION MAMMAPLASTY Bilateral 2014   TUBAL LIGATION      Prior to Admission medications   Medication Sig Start Date End Date Taking? Authorizing Provider  Ascorbic Acid (VITAMIN C PO) Take 1 tablet by mouth daily.   Yes [provider]  estradiol (ESTRACE) 1 MG tablet Take 1 tablet (1 mg total) by mouth daily. 09/14/21  Yes Woodroe Mode, MD  Ferrous Sulfate (IRON PO) Take 1 tablet by mouth daily.   Yes [provider]  ibuprofen (ADVIL) 600 MG tablet Take 1 tablet (600 mg total) by mouth every 8 (eight) hours as needed. Patient taking differently: Take 600 mg by mouth every 8 (eight) hours as needed for fever, headache or mild pain. 06/01/21  Yes Carollee Leitz, MD  ondansetron (ZOFRAN) 4 MG tablet Take 1 tablet (4 mg total) by mouth every 8 (eight) hours as needed for nausea or vomiting. 07/15/21  Yes Tegeler, Gwenyth Allegra, MD  VITAMIN D PO Take 1 tablet by mouth daily.   Yes [provider]  Camphor-Menthol-Methyl Sal (SALONPAS EX) Apply 1 patch topically daily as needed (pain). Patient not taking: Reported on 11/02/2021    [provider]  megestrol (MEGACE) 40 MG tablet Take 1 tablet (40 mg total) by mouth 2 (two) times daily. Patient not taking:  Reported on 10/19/2021 07/28/21   Woodroe Mode, MD  oxyCODONE-acetaminophen (PERCOCET) 10-325 MG tablet Take 1 tablet by mouth 3 (three) times daily as needed for pain. 06/24/21   [provider]  pantoprazole (PROTONIX) 40 MG tablet Take 1 tablet (40 mg total) by mouth daily. 05/28/20 10/13/20  Meccariello, Bernita Raisin, DO  potassium chloride (KLOR-CON) 10 MEQ tablet Take 1 tablet (10 mEq total) by mouth daily. 03/13/20 10/13/20  Garald Balding, PA-C    Current Outpatient Medications  Medication Sig Dispense Refill   Ascorbic Acid (VITAMIN C PO) Take 1 tablet by mouth daily.     estradiol (ESTRACE) 1 MG tablet Take 1 tablet (1 mg total) by mouth daily. 30 tablet 2   Ferrous Sulfate (IRON PO) Take 1 tablet by mouth daily.     ibuprofen (ADVIL) 600 MG tablet Take 1 tablet (600 mg total) by mouth every 8 (eight) hours as needed. (Patient taking differently: Take 600 mg by mouth every 8 (eight) hours as needed for fever, headache or mild pain.) 30 tablet 0   ondansetron (ZOFRAN) 4 MG tablet Take 1 tablet (4 mg total) by mouth every 8 (eight) hours as needed for nausea or vomiting. 12 tablet 0   VITAMIN D PO Take 1 tablet by mouth daily.     Camphor-Menthol-Methyl Sal (SALONPAS EX) Apply 1 patch topically daily as needed (pain). (Patient not taking: Reported on 11/02/2021)  megestrol (MEGACE) 40 MG tablet Take 1 tablet (40 mg total) by mouth 2 (two) times daily. (Patient not taking: Reported on 10/19/2021) 60 tablet 2   oxyCODONE-acetaminophen (PERCOCET) 10-325 MG tablet Take 1 tablet by mouth 3 (three) times daily as needed for pain.     Current Facility-Administered Medications  Medication Dose Route Frequency Provider Last Rate Last Admin   0.9 %  sodium chloride infusion  500 mL Intravenous Once Mauri Pole, MD        Allergies as of 11/02/2021   (No Known Allergies)    Family History  Problem Relation Age of Onset   Breast cancer Mother 52       deceased at 55    Hypertension Father    Parkinson's disease Father    Colon cancer Neg Hx    Colon polyps Neg Hx    Esophageal cancer Neg Hx    Rectal cancer Neg Hx    Stomach cancer Neg Hx     Social History   Socioeconomic History   Marital status: Single    Spouse name: Not on file   Number of children: Not on file   Years of education: Not on file   Highest education level: Not on file  Occupational History   Not on file  Tobacco Use   Smoking status: Former    Packs/day: 1.50    Years: 25.00    Pack years: 37.50    Types: Cigarettes    Quit date: 2020    Years since quitting: 3.0   Smokeless tobacco: Never  Vaping Use   Vaping Use: Some days  Substance and Sexual Activity   Alcohol use: Never   Drug use: Never   Sexual activity: Not Currently    Birth control/protection: Surgical  Other Topics Concern   Not on file  Social History Narrative   Not on file   Social Determinants of Health   Financial Resource Strain: Not on file  Food Insecurity: Not on file  Transportation Needs: Not on file  Physical Activity: Not on file  Stress: Not on file  Social Connections: Not on file  Intimate Partner Violence: Not on file    Review of Systems:  All other review of systems negative except as mentioned in the HPI.  Physical Exam: Vital signs in last 24 hours: BP 134/83    Pulse 82    Temp 98.7 F (37.1 C)    Ht 5\' 4"  (1.626 m)    Wt 198 lb (89.8 kg)    LMP 10/29/2021    SpO2 99%    BMI 33.99 kg/m  General:   Alert, NAD Lungs:  Clear .   Heart:  Regular rate and rhythm Abdomen:  Soft, nontender and nondistended. Neuro/Psych:  Alert and cooperative. Normal mood and affect. A and O x 3  Reviewed labs, radiology imaging, old records and pertinent past GI work up  Patient is appropriate for planned procedure(s) and anesthesia in an ambulatory setting   K. Denzil Magnuson , MD [phone]

## 2021-11-02 NOTE — Progress Notes (Signed)
Called to room to assist during endoscopic procedure.  Patient ID and intended procedure confirmed with present staff. Received instructions for my participation in the procedure from the performing physician.  

## 2021-11-02 NOTE — Progress Notes (Signed)
VSS, transported to PACU °

## 2021-11-02 NOTE — Op Note (Signed)
[facility] Patient Name: Sydney Cobb Procedure Date: 11/02/2021 10:58 AM MRN: [id number] Endoscopist: Mauri Pole , MD Age: 46 Referring MD:  Date of Birth: 01-19-1976 Gender: Female Account #: [id number] Procedure:                Colonoscopy Indications:              Screening for colorectal malignant neoplasm Medicines:                Monitored Anesthesia Care Procedure:                Pre-Anesthesia Assessment:                           - Prior to the procedure, a History and Physical                            was performed, and patient medications and                            allergies were reviewed. The patient's tolerance of                            previous anesthesia was also reviewed. The risks                            and benefits of the procedure and the sedation                            options and risks were discussed with the patient.                            All questions were answered, and informed consent                            was obtained. Prior Anticoagulants: The patient has                            taken no previous anticoagulant or antiplatelet                            agents. ASA Grade Assessment: II - A patient with                            mild systemic disease. After reviewing the risks                            and benefits, the patient was deemed in                            satisfactory condition to undergo the procedure.                           After obtaining informed consent, the colonoscope  was passed under direct vision. Throughout the                            procedure, the patient's blood pressure, pulse, and                            oxygen saturations were monitored continuously. The                            PCF-HQ190L Colonoscope was introduced through the                            anus and advanced to the the cecum, identified by                             appendiceal orifice and ileocecal valve. The                            colonoscopy was performed without difficulty. The                            patient tolerated the procedure well. The quality                            of the bowel preparation was good. The ileocecal                            valve, appendiceal orifice, and rectum were                            photographed. Scope In: 11:09:54 AM Scope Out: 11:22:42 AM Scope Withdrawal Time: 0 hours 8 minutes 41 seconds  Total Procedure Duration: 0 hours 12 minutes 48 seconds  Findings:                 The perianal and digital rectal examinations were                            normal.                           A 5 mm polyp was found in the transverse colon. The                            polyp was sessile. The polyp was removed with a                            cold snare. Resection and retrieval were complete.                           Non-bleeding internal hemorrhoids were found during                            retroflexion. The hemorrhoids were medium-sized. Complications:  No immediate complications. Estimated Blood Loss:     Estimated blood loss was minimal. Impression:               - One 5 mm polyp in the transverse colon, removed                            with a cold snare. Resected and retrieved.                           - Non-bleeding internal hemorrhoids. Recommendation:           - Patient has a contact number available for                            emergencies. The signs and symptoms of potential                            delayed complications were discussed with the                            patient. Return to normal activities tomorrow.                            Written discharge instructions were provided to the                            patient.                           - Resume previous diet.                           - Continue present medications.                           - Await  pathology results.                           - Repeat colonoscopy in 5-10 years for surveillance                            based on pathology results. Mauri Pole, MD 11/02/2021 11:25:54 AM This report has been signed electronically.

## 2021-11-02 NOTE — Patient Instructions (Signed)
Handout given for polyps.  Resume previous diet.  Continue present medications.  Await pathology results.    YOU HAD AN ENDOSCOPIC PROCEDURE TODAY AT [facility]:   Refer to the procedure report that was given to you for any specific questions about what was found during the examination.  If the procedure report does not answer your questions, please call your gastroenterologist to clarify.  If you requested that your care partner not be given the details of your procedure findings, then the procedure report has been included in a sealed envelope for you to review at your convenience later.  YOU SHOULD EXPECT: Some feelings of bloating in the abdomen. Passage of more gas than usual.  Walking can help get rid of the air that was put into your GI tract during the procedure and reduce the bloating. If you had a lower endoscopy (such as a colonoscopy or flexible sigmoidoscopy) you may notice spotting of blood in your stool or on the toilet paper. If you underwent a bowel prep for your procedure, you may not have a normal bowel movement for a few days.  Please Note:  You might notice some irritation and congestion in your nose or some drainage.  This is from the oxygen used during your procedure.  There is no need for concern and it should clear up in a day or so.  SYMPTOMS TO REPORT IMMEDIATELY:  Following lower endoscopy (colonoscopy or flexible sigmoidoscopy):  Excessive amounts of blood in the stool  Significant tenderness or worsening of abdominal pains  Swelling of the abdomen that is new, acute  Fever of 100F or higher  For urgent or emergent issues, a gastroenterologist can be reached at any hour by calling [phone]. Do not use MyChart messaging for urgent concerns.    DIET:  We do recommend a small meal at first, but then you may proceed to your regular diet.  Drink plenty of fluids but you should avoid alcoholic beverages for 24 hours.  ACTIVITY:  You should plan  to take it easy for the rest of today and you should NOT DRIVE or use heavy machinery until tomorrow (because of the sedation medicines used during the test).    FOLLOW UP: Our staff will call the number listed on your records 48-72 hours following your procedure to check on you and address any questions or concerns that you may have regarding the information given to you following your procedure. If we do not reach you, we will leave a message.  We will attempt to reach you two times.  During this call, we will ask if you have developed any symptoms of COVID 19. If you develop any symptoms (ie: fever, flu-like symptoms, shortness of breath, cough etc.) before then, please call [phone].  If you test positive for Covid 19 in the 2 weeks post procedure, please call and report this information to [place].    If any biopsies were taken you will be contacted by phone or by letter within the next 1-3 weeks.  Please call us at [phone] if you have not heard about the biopsies in 3 weeks.    SIGNATURES/CONFIDENTIALITY: You and/or your care partner have signed paperwork which will be entered into your electronic medical record.  These signatures attest to the fact that that the information above on your After Visit Summary has been reviewed and is understood.  Full responsibility of the confidentiality of this discharge information lies with you and/or your care-partner.

## 2021-11-03 ENCOUNTER — Other Ambulatory Visit: Payer: Self-pay

## 2021-11-03 ENCOUNTER — Encounter (HOSPITAL_BASED_OUTPATIENT_CLINIC_OR_DEPARTMENT_OTHER): Payer: Self-pay | Admitting: Obstetrics & Gynecology

## 2021-11-03 NOTE — Progress Notes (Signed)
Spoke w/ via phone for pre-op interview---pt Lab needs dos----  urine pregnancy POCT             Lab results------11/08/21 lab appt for CBC, type & screen, 07/15/21 Chest xray in Epic, 07/15/21 EKG in chart & Epic COVID test -----patient states asymptomatic no test needed Arrive at -------1100 on Wednesday, 11/10/21 NPO after MN NO Solid Food.  Clear liquids from MN until---1000 Med rec completed Medications to take morning of surgery -----Estrace, Zofran prn Diabetic medication -----n/a Patient instructed no nail polish to be worn day of surgery Patient instructed to bring photo id and insurance card day of surgery Patient aware to have Driver (ride ) / caregiver    for 24 hours after surgery - boyfriend [facility] Patient Special Instructions -----Extended recovery instructions given. Pre-Op special Istructions -----none Patient verbalized understanding of instructions that were given at this phone interview. Patient denies shortness of breath, chest pain, fever, cough at this phone interview.   Patient came into the ED on 07/15/21 due to abnormal uterine bleeding, fatigue, chest tightness/pain, SOB & abdominal discomfort. Chest xray, EKG, & troponins (07/15/21 in Epic) were normal. As of 11/03/21, patient stated that she has not had any chest pain or SOB since that time. See ED MD note in Epic dated 07/15/21 by Dr. Harrell Gave Tegeler.

## 2021-11-03 NOTE — Progress Notes (Signed)
PLEASE WEAR A MASK OUT IN PUBLIC AND SOCIAL DISTANCE AND [place] YOUR HANDS FREQUENTLY. PLEASE ASK ALL YOUR CLOSE HOUSEHOLD CONTACT TO WEAR MASK OUT IN PUBLIC AND SOCIAL DISTANCE AND [place] HANDS FREQUENTLY ALSO.      Your procedure is scheduled on Wednesday, 11/10/21.  Report to [facility] AT  11:00 am.   Call this number if you have problems the morning of surgery  [phone].   OUR ADDRESS IS [place].  WE ARE LOCATED IN [facility].  PLEASE BRING YOUR INSURANCE CARD AND PHOTO ID DAY OF SURGERY.  ONLY ONE PERSON ALLOWED IN FACILITY WAITING AREA.                                     REMEMBER:  DO NOT EAT FOOD, CANDY GUM OR MINTS  AFTER MIDNIGHT THE NIGHT BEFORE YOUR SURGERY . YOU MAY HAVE CLEAR LIQUIDS FROM MIDNIGHT THE NIGHT BEFORE YOUR SURGERY UNTIL  10:00 am. NO CLEAR LIQUIDS AFTER   10:00 am DAY OF SURGERY.   YOU MAY  BRUSH YOUR TEETH MORNING OF SURGERY AND RINSE YOUR MOUTH OUT, NO CHEWING GUM CANDY OR MINTS.    CLEAR LIQUID DIET   Foods Allowed                                                                     Foods Excluded  Coffee and tea, regular and decaf                             liquids that you cannot  Plain Jell-O any favor except red or purple                                           see through such as: Fruit ices (not with fruit pulp)                                     milk, soups, orange juice  Iced Popsicles                                    All solid food Carbonated beverages, regular and diet                                    Cranberry, grape and apple juices Sports drinks like Gatorade  Sample Menu Breakfast                                Lunch  Supper Cranberry juice                                           Jell-O                                     Grape juice                           Apple juice Coffee or tea                        Jell-O                                       Popsicle                                                Coffee or tea                        Coffee or tea  _____________________________________________________________________     TAKE THESE MEDICATIONS MORNING OF SURGERY WITH A SIP OF WATER:  Estrace, Zofran as needed  ONE VISITOR IS ALLOWED IN WAITING ROOM ONLY DAY OF SURGERY.  YOU MAY HAVE ANOTHER PERSON SWITCH OUT WITH THE  1  VISITOR IN THE WAITING ROOM DAY OF SURGERY AND A MASK MUST BE WORN IN THE WAITING ROOM.    2 VISITORS  MAY VISIT IN THE EXTENDED RECOVERY ROOM UNTIL 800 PM ONLY 1 VISITOR AGE 27 AND OVER MAY SPEND THE NIGHT AND MUST BE IN EXTENDED RECOVERY ROOM NO LATER THAN 800 PM .    UP TO 2 CHILDREN AGE 100 TO 15 MAY ALSO VISIT IN EXTENDED RECOV ERY ROOM ONLY UNTIL 800 PM AND MUST LEAVE BY 800 PM.   ALL PERSONS VISITING IN EXTENDED RECOVERY ROOM MUST WEAR A MASK.                                    DO NOT WEAR JEWERLY, MAKE UP. DO NOT WEAR LOTIONS, POWDERS, PERFUMES OR NAIL POLISH ON YOUR FINGERNAILS. TOENAIL POLISH IS OK TO WEAR. DO NOT SHAVE FOR 48 HOURS PRIOR TO DAY OF SURGERY. MEN MAY SHAVE FACE AND NECK. CONTACTS, GLASSES, OR DENTURES MAY NOT BE WORN TO SURGERY.                                    [facility] IS NOT RESPONSIBLE  FOR ANY BELONGINGS.                                                                    Marland Kitchen  [place] - Preparing for Surgery Before surgery, you can play an important role.  Because skin is not sterile, your skin needs to be as free of germs as possible.  You can reduce the number of germs on your skin by washing with CHG (chlorahexidine gluconate) soap before surgery.  CHG is an antiseptic cleaner which kills germs and bonds with the skin to continue killing germs even after washing. Please DO NOT use if you have an allergy to CHG or antibacterial soaps.  If your skin becomes reddened/irritated stop using the CHG and inform your nurse when you arrive at Short Stay. Do not  shave (including legs and underarms) for at least 48 hours prior to the first CHG shower.  You may shave your face/neck. Please follow these instructions carefully:  1.  Shower with CHG Soap the night before surgery and the  morning of Surgery.  2.  If you choose to wash your hair, wash your hair first as usual with your  normal  shampoo.  3.  After you shampoo, rinse your hair and body thoroughly to remove the  shampoo.                            4.  Use CHG as you would any other liquid soap.  You can apply chg directly  to the skin and wash                      Gently with a scrungie or clean washcloth.  5.  Apply the CHG Soap to your body ONLY FROM THE NECK DOWN.   Do not use on face/ open                           Wound or open sores. Avoid contact with eyes, ears mouth and genitals (private parts).                       Wash face,  Genitals (private parts) with your normal soap.             6.  Wash thoroughly, paying special attention to the area where your surgery  will be performed.  7.  Thoroughly rinse your body with warm water from the neck down.  8.  DO NOT shower/wash with your normal soap after using and rinsing off  the CHG Soap.                9.  Pat yourself dry with a clean towel.            10.  Wear clean pajamas.            11.  Place clean sheets on your bed the night of your first shower and do not  sleep with pets. Day of Surgery : Do not apply any lotions/deodorants the morning of surgery.  Please wear clean clothes to the hospital/surgery center.  IF YOU HAVE ANY SKIN IRRITATION OR PROBLEMS WITH THE SURGICAL SOAP, PLEASE GET A BAR OF GOLD DIAL SOAP AND SHOWER THE NIGHT BEFORE YOUR SURGERY AND THE MORNING OF YOUR SURGERY. PLEASE LET THE NURSE KNOW MORNING OF YOUR SURGERY IF YOU HAD ANY PROBLEMS WITH THE SURGICAL SOAP.   ________________________________________________________________________  QUESTIONS Holland Falling  PRE OP NURSE PHONE [phone].

## 2021-11-04 ENCOUNTER — Telehealth: Payer: Self-pay

## 2021-11-04 NOTE — Telephone Encounter (Signed)
°  Follow up Call-  Call back number 11/02/2021  Post procedure Call Back phone  # [phone]  Permission to leave phone message Yes     Patient questions:  Do you have a fever, pain , or abdominal swelling? No. Pain Score  0 *  Have you tolerated food without any problems? Yes.    Have you been able to return to your normal activities? Yes.    Do you have any questions about your discharge instructions: Diet   No. Medications  No. Follow up visit  No.  Do you have questions or concerns about your Care? No.  Actions: * If pain score is 4 or above: No action needed, pain <4.

## 2021-11-08 ENCOUNTER — Encounter (HOSPITAL_COMMUNITY): Admission: RE | Admit: 2021-11-08 | Payer: Medicaid Other | Source: Ambulatory Visit

## 2021-11-08 NOTE — Progress Notes (Signed)
Patient did not come in for scheduled lab appt this morning. I called her and she said that she did not have transportation. She also said she was unaware that her surgery was scheduled for Wednesday. She stated that she wants to reschedule. I instructed her to call the office of her surgeon, Dr. Emeterio Reeve.

## 2021-11-10 DIAGNOSIS — Z01818 Encounter for other preprocedural examination: Secondary | ICD-10-CM

## 2021-11-16 ENCOUNTER — Other Ambulatory Visit: Payer: Medicare Other | Admitting: Obstetrics

## 2021-11-18 ENCOUNTER — Encounter: Payer: Self-pay | Admitting: Gastroenterology

## 2021-11-19 DIAGNOSIS — G4489 Other headache syndrome: Secondary | ICD-10-CM | POA: Diagnosis not present

## 2021-11-19 DIAGNOSIS — R9431 Abnormal electrocardiogram [ECG] [EKG]: Secondary | ICD-10-CM | POA: Diagnosis not present

## 2021-11-19 DIAGNOSIS — Z87891 Personal history of nicotine dependence: Secondary | ICD-10-CM | POA: Diagnosis not present

## 2021-11-19 DIAGNOSIS — R519 Headache, unspecified: Secondary | ICD-10-CM | POA: Diagnosis not present

## 2021-11-19 DIAGNOSIS — I10 Essential (primary) hypertension: Secondary | ICD-10-CM | POA: Diagnosis not present

## 2021-11-22 DIAGNOSIS — M25562 Pain in left knee: Secondary | ICD-10-CM | POA: Diagnosis not present

## 2021-11-25 ENCOUNTER — Ambulatory Visit: Payer: Medicare HMO

## 2021-12-09 ENCOUNTER — Inpatient Hospital Stay
Admit: 2021-12-09 | Discharge: 2021-12-10 | Disposition: A | Discharge status: Home / Self Care | Payer: MEDICARE | Attending: Emergency Medicine

## 2021-12-09 ENCOUNTER — Encounter

## 2021-12-09 ENCOUNTER — Emergency Department: Admit: 2021-12-10 | Payer: MEDICARE

## 2021-12-09 DIAGNOSIS — G5602 Carpal tunnel syndrome, left upper limb: Secondary | ICD-10-CM | POA: Diagnosis not present

## 2021-12-09 DIAGNOSIS — R519 Headache, unspecified: Secondary | ICD-10-CM | POA: Diagnosis not present

## 2021-12-09 DIAGNOSIS — M25562 Pain in left knee: Secondary | ICD-10-CM | POA: Diagnosis not present

## 2021-12-09 DIAGNOSIS — G5601 Carpal tunnel syndrome, right upper limb: Secondary | ICD-10-CM | POA: Diagnosis not present

## 2021-12-09 DIAGNOSIS — I10 Essential (primary) hypertension: Secondary | ICD-10-CM | POA: Diagnosis not present

## 2021-12-09 MED ORDER — PROCHLORPERAZINE EDISYLATE 10 MG/2ML IJ SOLN
102 MG/2ML | INTRAMUSCULAR | Status: AC
Start: 2021-12-09 — End: 2021-12-09
  Administered 2021-12-10: 10 mg via INTRAVENOUS

## 2021-12-09 MED ORDER — KETOROLAC TROMETHAMINE 15 MG/ML IJ SOLN
15 MG/ML | Freq: Once | INTRAMUSCULAR | Status: AC
Start: 2021-12-09 — End: 2021-12-09
  Administered 2021-12-10: 15 mg via INTRAVENOUS

## 2021-12-09 MED ORDER — DIPHENHYDRAMINE HCL 50 MG/ML IJ SOLN
50 MG/ML | INTRAMUSCULAR | Status: AC
Start: 2021-12-09 — End: 2021-12-09
  Administered 2021-12-10: 25 mg via INTRAVENOUS

## 2021-12-09 MED ORDER — SODIUM CHLORIDE 0.9 % IV BOLUS
0.9 % | Freq: Once | INTRAVENOUS | Status: AC
Start: 2021-12-09 — End: 2021-12-09
  Administered 2021-12-10: 1000 mL via INTRAVENOUS

## 2021-12-09 NOTE — ED Triage Notes (Signed)
Patient reports several days of head ache and high blood pressure. Patient reports recent move to [facility], and has had BP meds prescribed in [place] but did not fill that prescription. Patient reports bilateral hand tightness/numbness and was evaluated today at urgent care prescribed steroids and referred to neurology.

## 2021-12-09 NOTE — ED Notes (Signed)
I have reviewed discharge instructions with the patient.  The patient verbalized understanding.    Patient left ED via Discharge Method: ambulatory to Home with self  .    Opportunity for questions and clarification provided.       Patient given 1 scripts.         To continue your aftercare when you leave the hospital, you may receive an automated call from our care team to check in on how you are doing.  This is a free service and part of our promise to provide the best care and service to meet your aftercare needs.??? If you have questions, or wish to unsubscribe from this service please call [phone].  Thank you for Choosing our [facility] Emergency Department.       Darden Amber, RN  12/09/21 2015

## 2021-12-09 NOTE — Telephone Encounter (Signed)
Location of patient: [facility]    Received call from [place] at [employer] with [employer].    Subjective: Caller states "Pt was seen at [facility] today for numbness in her hand. Was told by staff to call this line to establish care for her high blood pressure.    Denies chest pain, SOB    Current BP 193/128    Current Symptoms: headache, light headed, numbness in hands    Onset: 2 months been having on and off headaches      Pain Severity: 8/10; pressure    Temperature: denies     What has been tried: tylenol      Recommended disposition: Go to ED Now    Care advice provided, patient verbalizes understanding; denies any other questions or concerns; instructed to call back for any new or worsening symptoms.    Patient/caller agrees to proceed to nearest Emergency Department    Pt warm transferred to [facility] to set up appointment to establish care.    Attention Provider:  Thank you for allowing me to participate in the care of your patient.  The patient was connected to triage in response to information provided to the ECC/PSC.  Please do not respond through this encounter as the response is not directed to a shared pool.        Reason for Disposition   [1] Systolic BP  >= 160 OR Diastolic >= 100 AND [2] cardiac or neurologic symptoms (e.g., chest pain, difficulty breathing, unsteady gait, blurred vision)    Protocols used: Blood Pressure - High-ADULT-AH

## 2021-12-09 NOTE — Discharge Instructions (Addendum)
CT head was normal.  Take Tylenol/Motrin for residual headaches.    Provided a prescription for amlodipine for your blood pressure.    Please return with any worsening symptoms or concerns.

## 2021-12-09 NOTE — ED Notes (Signed)
Medicated as ordered with health teaching, IV site intact.        Bing Matter, RN  12/09/21 1920

## 2021-12-09 NOTE — ED Provider Notes (Signed)
Emergency Department Provider Note                   PCP:                None None               Age: 46 y.o.      Sex: female     DISPOSITION Decision To Discharge 12/09/2021 07:51:11 PM       ICD-10-CM    1. Acute nonintractable headache, unspecified headache type  R51.9       2. Essential hypertension  I10           MEDICAL DECISION MAKING  Complexity of Problems Addressed:  1 stable acute illness    Data Reviewed and Analyzed:  Category 1:     I ordered each unique test.  I reviewed the results of each unique test.        Category 2:   I independently ordered and reviewed the CT Scan.  No acute findings    Category 3: Discussion of management or test interpretation.  46 year old female presenting with persistent headaches for the past week.  States she has had headaches intermittently most days.  Denies any associated symptoms or exertional exacerbation.  Has been taking Tylenol.  Patient vitally stable upon arrival.  Neurologically intact.  Given her persistent symptoms, we will check head CT and treat with migraine cocktail for now.  Head CT ultimately negative.  States her headache has resolved.  Patient also requesting refill of her home BP medication.  Patient currently stable for discharge home for primary follow-up.  Patient verbalizes understanding and is agreeable with this plan.    ED Course as of 12/10/21 1119   Thu Dec 09, 2021   1933 No acute findings on head CT. [LB]      ED Course User Index  [LB] Vonzella Nipple, PA       Risk of Complications and/or Morbidity of Patient Management:  Patient was discharged risks and benefits of hospitalization were considered     Tatum Corl is a 46 y.o. female who presents to the Emergency Department with chief complaint of    Chief Complaint   Patient presents with    Headache      Patient is a 46 year old female with no stated medical history presenting to the emergency department for evaluation of persistent headaches.  Patient states that she  has had a headache almost every day for the past week.  States that it is intermittent throughout the day and a waxing and waning pattern.  Denies any exertional exacerbation to this.  Denies any sudden onset.  Denies any associated visual changes, nausea, vomiting, fever or chills.  Has taken a few Tylenol without much relief.  States she has not presented for this problem recently as she is in a recovery program.  Patient also mentioning numbness and tingling in her bilateral hands.  States she was seen by urgent care earlier today stated she may have carpal tunnel bilaterally.  She denies any numbness to her proximal upper extremities.  She has no other complaints today.    The history is provided by the patient.      Review of Systems   Constitutional:  Negative for chills, fatigue and fever.   Eyes:  Negative for visual disturbance.   Respiratory:  Negative for chest tightness and shortness of breath.    Cardiovascular:  Negative for chest pain and palpitations.  Gastrointestinal:  Negative for abdominal pain, diarrhea, nausea and vomiting.   Genitourinary:  Negative for dysuria.   Musculoskeletal:  Negative for arthralgias and myalgias.   Skin:  Negative for color change and wound.   Neurological:  Positive for headaches. Negative for dizziness, syncope, weakness and light-headedness.   All other systems reviewed and are negative.    Vitals signs and nursing note reviewed.   No data found.         Physical Exam  Vitals and nursing note reviewed.   Constitutional:       General: She is not in acute distress.     Appearance: Normal appearance. She is not ill-appearing.   HENT:      Head: Normocephalic and atraumatic.      Right Ear: External ear normal.      Left Ear: External ear normal.      Nose: Nose normal.   Eyes:      General: No scleral icterus.        Right eye: No discharge.         Left eye: No discharge.      Extraocular Movements: Extraocular movements intact.      Conjunctiva/sclera: Conjunctivae  normal.      Pupils: Pupils are equal, round, and reactive to light.   Cardiovascular:      Rate and Rhythm: Normal rate and regular rhythm.      Pulses: Normal pulses.      Heart sounds: Normal heart sounds.   Pulmonary:      Effort: Pulmonary effort is normal.      Breath sounds: Normal breath sounds.   Abdominal:      General: Abdomen is flat.      Palpations: Abdomen is soft.      Tenderness: There is no abdominal tenderness.   Musculoskeletal:         General: Normal range of motion.      Cervical back: Normal range of motion and neck supple.   Skin:     General: Skin is warm and dry.   Neurological:      General: No focal deficit present.      Mental Status: She is alert and oriented to person, place, and time.      Cranial Nerves: No cranial nerve deficit.      Sensory: No sensory deficit.      Motor: No weakness.      Coordination: Coordination normal.      Gait: Gait normal.   Psychiatric:         Mood and Affect: Mood normal.         Behavior: Behavior normal.        Procedures     Orders Placed This Encounter   Procedures    CT HEAD WO CONTRAST        Medications   0.9 % sodium chloride bolus (0 mLs IntraVENous Stopped 12/09/21 2006)   ketorolac (TORADOL) injection 15 mg (15 mg IntraVENous Given 12/09/21 1915)   prochlorperazine (COMPAZINE) injection 10 mg (10 mg IntraVENous Given 12/09/21 1914)   diphenhydrAMINE (BENADRYL) injection 25 mg (25 mg IntraVENous Given 12/09/21 1914)       Discharge Medication List as of 12/09/2021  8:07 PM        START taking these medications    Details   amLODIPine (NORVASC) 5 MG tablet Take 1 tablet by mouth daily, Disp-30 tablet, R-0Print  History reviewed. No pertinent past medical history.     Past Surgical History:   Procedure Laterality Date    BREAST SURGERY          History reviewed. No pertinent family history.     Social History     Socioeconomic History    Marital status: Single     Spouse name: None    Number of children: None    Years of education: None     Highest education level: None   Tobacco Use    Smoking status: Never    Smokeless tobacco: Never   Vaping Use    Vaping Use: Never used   Substance and Sexual Activity    Alcohol use: Not Currently    Drug use: Not Currently        Allergies: Patient has no known allergies.    Discharge Medication List as of 12/09/2021  8:07 PM        CONTINUE these medications which have NOT CHANGED    Details   meloxicam (MOBIC) 7.5 MG tablet Take by mouth dailyHistorical Med              Results for orders placed or performed during the hospital encounter of 12/09/21   CT HEAD WO CONTRAST    Narrative    EXAMINATION: CT HEAD WITHOUT CONTRAST    DATE: 12/09/2021 6:50 PM    INDICATION: Persistent headaches    COMPARISON: None available at the time of this dictation.    TECHNIQUE: Noncontrast imaging obtained from the vertex to the skull base.  CT   dose lowering techniques were used, to include: automated exposure control,   adjustment for patient size, and or use of iterative reconstruction.?    FINDINGS:    Soft Tissues: No significant soft tissue abnormality.    Skull: No underlying skull fracture or radiopaque foreign body.    Sinuses: Paranasal sinuses are clear.    Mastoids: Mastoid air cells are clear.     Globes and Orbits: Globes and orbits are intact.    Brain: No acute hemorrhage.  No midline shift, masses, or mass effect.  No   evidence of acute infarct by noncontrast CT.    Ventricles and Cisterns: Ventricular size and configuration is within normal   limits. Basal cisterns are patent. No abnormal extra-axial fluid collection.    Senescent Changes: None.        Impression    No acute intracranial abnormality.       Rudolpho Sevin, M.D.   12/09/2021 7:29:00 PM        CT HEAD WO CONTRAST   Final Result      No acute intracranial abnormality.          Rudolpho Sevin, M.D.    12/09/2021 7:29:00 PM                        Voice dictation software was used during the making of this note.  This software is not perfect and grammatical  and other typographical errors may be present.  This note has not been completely proofread for errors.      Vonzella Nipple, [place]  12/10/21 1119

## 2021-12-09 NOTE — ED Notes (Signed)
Pt c/o HA x days, denies sensitivity to light or visual changes, states just a 'normal' HA; also c/o tingling in bilateral hands x months. Denies known neck injury/heavy lifting, states hasn't been seen for bc she is currently in a rehab program and does not want to go home.      Bing Matter, RN  12/09/21 [phone]

## 2021-12-10 MED ORDER — AMLODIPINE BESYLATE 5 MG PO TABS
5 MG | ORAL_TABLET | Freq: Every day | ORAL | 0 refills | Status: DC
Start: 2021-12-10 — End: 2021-12-14

## 2021-12-10 MED FILL — DIPHENHYDRAMINE HCL 50 MG/ML IJ SOLN: 50 MG/ML | INTRAMUSCULAR | Qty: 1

## 2021-12-10 MED FILL — KETOROLAC TROMETHAMINE 15 MG/ML IJ SOLN: 15 MG/ML | INTRAMUSCULAR | Qty: 1

## 2021-12-10 MED FILL — PROCHLORPERAZINE EDISYLATE 10 MG/2ML IJ SOLN: 10 MG/2ML | INTRAMUSCULAR | Qty: 2

## 2021-12-14 ENCOUNTER — Ambulatory Visit: Admit: 2021-12-14 | Discharge: 2021-12-14 | Payer: MEDICARE | Attending: Family | Primary: Family

## 2021-12-14 DIAGNOSIS — Z1159 Encounter for screening for other viral diseases: Secondary | ICD-10-CM | POA: Diagnosis not present

## 2021-12-14 DIAGNOSIS — E559 Vitamin D deficiency, unspecified: Secondary | ICD-10-CM | POA: Diagnosis not present

## 2021-12-14 DIAGNOSIS — G5603 Carpal tunnel syndrome, bilateral upper limbs: Secondary | ICD-10-CM | POA: Diagnosis not present

## 2021-12-14 DIAGNOSIS — M25562 Pain in left knee: Secondary | ICD-10-CM | POA: Diagnosis not present

## 2021-12-14 DIAGNOSIS — Z114 Encounter for screening for human immunodeficiency virus [HIV]: Secondary | ICD-10-CM | POA: Diagnosis not present

## 2021-12-14 DIAGNOSIS — L989 Disorder of the skin and subcutaneous tissue, unspecified: Secondary | ICD-10-CM | POA: Diagnosis not present

## 2021-12-14 DIAGNOSIS — Z13220 Encounter for screening for lipoid disorders: Secondary | ICD-10-CM | POA: Diagnosis not present

## 2021-12-14 DIAGNOSIS — Z131 Encounter for screening for diabetes mellitus: Secondary | ICD-10-CM | POA: Diagnosis not present

## 2021-12-14 DIAGNOSIS — I10 Essential (primary) hypertension: Secondary | ICD-10-CM | POA: Diagnosis not present

## 2021-12-14 DIAGNOSIS — R300 Dysuria: Secondary | ICD-10-CM | POA: Diagnosis not present

## 2021-12-14 LAB — AMB POC URINALYSIS DIP STICK AUTO W/O MICRO
Bilirubin, Urine, POC: NEGATIVE
Blood, Urine, POC: NEGATIVE
Glucose, Urine, POC: NEGATIVE
Ketones, Urine, POC: NEGATIVE
Leukocyte Esterase, Urine, POC: NEGATIVE
Nitrite, Urine, POC: NEGATIVE
Protein, Urine, POC: NEGATIVE
Specific Gravity, Urine, POC: 1.03 (ref 1.001–1.035)
Urobilinogen, POC: NORMAL
pH, Urine, POC: 5 (ref 4.6–8.0)

## 2021-12-14 MED ORDER — FLUCONAZOLE 150 MG PO TABS
150 MG | ORAL_TABLET | ORAL | 0 refills | Status: AC
Start: 2021-12-14 — End: ?

## 2021-12-14 MED ORDER — AMLODIPINE BESYLATE 10 MG PO TABS
10 MG | ORAL_TABLET | Freq: Every day | ORAL | 1 refills | Status: AC
Start: 2021-12-14 — End: ?

## 2021-12-14 MED ORDER — DICLOFENAC SODIUM 50 MG PO TBEC
50 MG | ORAL_TABLET | Freq: Two times a day (BID) | ORAL | 0 refills | Status: DC | PRN
Start: 2021-12-14 — End: 2022-01-10

## 2021-12-14 NOTE — Progress Notes (Signed)
PROGRESS NOTE    Chief Complaint   Patient presents with    New Patient     Presenting to est care. States that she is from [place] and was referred by urgent care on [address]. States that she has an MRI scheduled for her knee. Has an odor when she urinates and has a clear discharge that has an odor. Would like a referral to neuro, numbness of bilateral hands. States that she was dx with carpal tunnel, rx prednisone that is not effective. Has migraines as well but does not know if it is related to her HTN, rx for 800 mg ibuprofen. 195/165 last night. Currently in Rehab.       SUBJECTIVE:     Lisa Savage is a very pleasant 46 y.o. female with hx of hypertension, drug use-currently in rehab per patient, seen today in office as new patient to establish care.     Knee pain:  She reports she was seen in [place] last month for complaints of knee pain.  She was recommended for an MRI as she reports having a sudden pop sensation and since then has had knee pain as well as swelling.  She was seen by orthopedic in [place] but moved to [place] more recently and was not able to get follow-up.  She was seen at urgent care who prescribed a course of prednisone which patient reports that medication did give some improvement.  She had a prescription of meloxicam 7.5 mg but reports no significant improvement in symptoms.  Due to her current rehab program, she is not able to take in the sedate of type medication such as gabapentin..  She is scheduled to have an MRI of her knee on 3/20.  She has completed her course of prednisone with last dose yesterday.    Carpal Tunnel:  Patient also has complaints of bilateral hand and finger numbness and tingling.  She reports symptom has been present for several months and progressively worsening.  She has tried OTC carpal tunnel gloves and she does have them o n today during her visit.  She reports no improvement with symptoms.  She reports meloxicam provided no relief  in addition to OTC NSAIDs such as ibuprofen/Aleve.  She also had a prescription for prednisone which she recently completed but did not noticed any improvement. She was offered a prescription for gabapentin by one of her previous provider but reports that due to her current rehab program, she is not able to take medication.  She has never been seen or evaluated by Ortho for her symptoms.    Hypertension:  Patient also has been noted to have elevated blood pressure reading.  She does report associated headache for which she was recently seen in the ED and had a negative CT scan of the head.  She was started on amlodipine 5 mg.  She reports having taking 1-2 doses so far.  Her blood pressure at her current residence has been still elevated.  She reports systolic reading has been noted into the 160 or 170.  She does have associated headache whenever BP is elevated as such.  She reports no chest pain, no shortness of breath, no palpitation, no unilateral or focal weakness, no nausea, no vomiting, no diarrhea, no changes in no incontinence of bladder or bowel function.    Skin lesion:  Patient also has a skin lesion that she has had to her left upper inner lower leg for many years.  She reports  started out as small but over the years, the lesion has increased in size and now is quite painful whenever it is palpated.  She has also noted some change in coloration of skin lesion.  She would like to have that evaluated today.    Dysuria:  Patient also has complaints of irritation and intermittent itching to perineal area and vagina area.  She has noted some slight discomfort and burning with urination and foul odor urine.  She also noted a clear discharge vaginally intermittently.  She reports no sexual intercourse for months and has no concern for STD exposure.  Pertinent negatives include no fever or chills, no chest pain, no shortness of breath, no palpitation, no unilateral focal weakness, no nausea, no vomiting, no  diarrhea, no hematuria, no urinary urgency or frequency, no incontinence of bladder or bowel function.    Past Medical History, Past Surgical History, Family history, Social History, and Medications were all reviewed with the patient today and updated as necessary.       Current Outpatient Medications   Medication Sig Dispense Refill    predniSONE (DELTASONE) 20 MG tablet       diclofenac (VOLTAREN) 50 MG EC tablet Take 1 tablet by mouth 2 times daily as needed for Pain With food. No ibuprofen, Motrin, naproxen, Aleve, Excedrin, BC or Goody powder. 60 tablet 0    amLODIPine (NORVASC) 10 MG tablet Take 1 tablet by mouth daily For blood pressure 90 tablet 1    fluconazole (DIFLUCAN) 150 MG tablet Take 1 tablet with food orally once today for yeast infection, may repeat second dose in 3 days if needed 2 tablet 0    meloxicam (MOBIC) 7.5 MG tablet Take by mouth daily       No current facility-administered medications for this visit.     No Known Allergies  There is no problem list on file for this patient.    History reviewed. No pertinent past medical history.  Past Surgical History:   Procedure Laterality Date    BREAST SURGERY       History reviewed. No pertinent family history.  Social History     Tobacco Use    Smoking status: Never    Smokeless tobacco: Never   Substance Use Topics    Alcohol use: Not Currently         REVIEW OF SYSTEM    Review of Systems   Constitutional: Negative.  Negative for activity change, appetite change, chills, diaphoresis, fatigue, fever and unexpected weight change.   HENT: Negative.  Negative for congestion, dental problem, drooling, ear discharge, ear pain, facial swelling, hearing loss, mouth sores, nosebleeds, postnasal drip, rhinorrhea, sinus pressure, sinus pain, sneezing, sore throat, tinnitus, trouble swallowing and voice change.    Eyes: Negative.  Negative for photophobia, pain, discharge, redness, itching and visual disturbance.   Respiratory: Negative.  Negative for apnea,  cough, choking, chest tightness, shortness of breath, wheezing and stridor.    Cardiovascular: Negative.  Negative for chest pain, palpitations and leg swelling.   Gastrointestinal: Negative.  Negative for abdominal distention, abdominal pain, anal bleeding, blood in stool, constipation, diarrhea, nausea, rectal pain and vomiting.   Endocrine: Negative.  Negative for cold intolerance, heat intolerance, polydipsia, polyphagia and polyuria.   Genitourinary: Negative.  Negative for decreased urine volume, difficulty urinating, dysuria, enuresis, flank pain, frequency, genital sores, hematuria and urgency.   Musculoskeletal:  Positive for arthralgias (Knee pain, bilateral wrist discomfort with numbness and tingling to fingers and hand) and  joint swelling (Left knee). Negative for back pain, gait problem, myalgias, neck pain and neck stiffness.   Skin: Negative.  Negative for color change, pallor, rash and wound.        Skin lesion to left lower leg to the upper inner aspect   Allergic/Immunologic: Negative.  Negative for environmental allergies, food allergies and immunocompromised state.   Neurological: Negative.  Negative for dizziness, tremors, seizures, syncope, facial asymmetry, speech difficulty, weakness, light-headedness, numbness and headaches.   Hematological: Negative.  Negative for adenopathy. Does not bruise/bleed easily.   Psychiatric/Behavioral: Negative.  Negative for agitation, behavioral problems, confusion, decreased concentration, dysphoric mood, hallucinations, self-injury, sleep disturbance and suicidal ideas. The patient is not nervous/anxious and is not hyperactive.      OBJECTIVE:  BP (!) 160/100 (Site: Right Upper Arm, Position: Sitting, Cuff Size: Medium Adult)    Pulse 97    Resp 18    Ht 5\' 4"  (1.626 m)    Wt 208 lb (94.3 kg)    SpO2 (!) 88%    BMI 35.70 kg/m??      Physical Exam  Constitutional:       General: She is not in acute distress.     Appearance: Normal appearance. She is obese. She  is not ill-appearing, toxic-appearing or diaphoretic.   HENT:      Head: Normocephalic and atraumatic.      Right Ear: Tympanic membrane, ear canal and external ear normal. There is no impacted cerumen.      Left Ear: Tympanic membrane, ear canal and external ear normal. There is no impacted cerumen.      Nose: Nose normal.      Mouth/Throat:      Mouth: Mucous membranes are moist.      Pharynx: Oropharynx is clear.   Eyes:      Extraocular Movements: Extraocular movements intact.      Conjunctiva/sclera: Conjunctivae normal.      Pupils: Pupils are equal, round, and reactive to light.   Neck:      Vascular: No carotid bruit.   Cardiovascular:      Rate and Rhythm: Normal rate and regular rhythm.      Pulses: Normal pulses.      Heart sounds: Normal heart sounds. No murmur heard.    No friction rub. No gallop.   Pulmonary:      Effort: Pulmonary effort is normal. No respiratory distress.      Breath sounds: Normal breath sounds. No stridor. No wheezing, rhonchi or rales.   Chest:      Chest wall: No tenderness.   Abdominal:      General: Abdomen is flat. Bowel sounds are normal. There is no distension.      Palpations: Abdomen is soft. There is no shifting dullness, fluid wave, hepatomegaly, splenomegaly, mass or pulsatile mass.      Tenderness: There is no abdominal tenderness. There is no right CVA tenderness, left CVA tenderness, guarding or rebound. Negative signs include Murphy's sign, Rovsing's sign, McBurney's sign, psoas sign and obturator sign.      Hernia: No hernia is present.   Musculoskeletal:         General: Swelling present. Normal range of motion.      Right wrist: No swelling, deformity, effusion, lacerations, tenderness, bony tenderness, snuff box tenderness or crepitus. Normal range of motion. Normal pulse.      Left wrist: No swelling, deformity, effusion, lacerations, tenderness, bony tenderness, snuff box tenderness or crepitus. Normal range of  motion. Normal pulse.      Cervical back: Normal  range of motion and neck supple. No rigidity or tenderness.      Left knee: Effusion and crepitus present. No swelling, deformity, erythema, ecchymosis, lacerations or bony tenderness. Normal range of motion. Tenderness present. No medial joint line, lateral joint line, MCL, LCL, ACL, PCL or patellar tendon tenderness. No LCL laxity, MCL laxity, ACL laxity or PCL laxity.Normal alignment, normal meniscus and normal patellar mobility. Normal pulse.      Instability Tests: Anterior drawer test negative. Posterior drawer test negative. Anterior Lachman test negative. Medial McMurray test negative and lateral McMurray test negative.      Comments: Positive for Tinel and Phalen sign to bilateral wrist   Lymphadenopathy:      Cervical: No cervical adenopathy.   Skin:     General: Skin is warm and dry.      Capillary Refill: Capillary refill takes less than 2 seconds.   Neurological:      General: No focal deficit present.      Mental Status: She is alert and oriented to person, place, and time.   Psychiatric:         Mood and Affect: Mood normal.         Behavior: Behavior normal.         Thought Content: Thought content normal.         Judgment: Judgment normal.         Medical problems and test results were reviewed with the patient today.   CT HEAD WO CONTRAST [[id number]]  Status: Final result     Order Providers    Authorizing Billing   Vonzella Nipple, [place] Meriel Flavors, MD            Signed by    Signed Date/Time Phone Pager   [place], [place] A 12/09/2021  7:29 PM [phone]      Reading Providers    Read Date Phone Pager   Rudolpho Sevin A Thu Dec 09, 2021  7:29 PM [phone]        CT HEAD WO CONTRAST: Patient Communication     Not Released  Not seen     Radiation Dose Estimates    No radiation information found for this patient  Narrative   EXAMINATION: CT HEAD WITHOUT CONTRAST       DATE: 12/09/2021 6:50 PM       INDICATION: Persistent headaches       COMPARISON: None available at the time of this  dictation.       TECHNIQUE: Noncontrast imaging obtained from the vertex to the skull base.  CT    dose lowering techniques were used, to include: automated exposure control,    adjustment for patient size, and or use of iterative reconstruction.?       FINDINGS:       Soft Tissues: No significant soft tissue abnormality.       Skull: No underlying skull fracture or radiopaque foreign body.       Sinuses: Paranasal sinuses are clear.       Mastoids: Mastoid air cells are clear.        Globes and Orbits: Globes and orbits are intact.       Brain: No acute hemorrhage.  No midline shift, masses, or mass effect.  No    evidence of acute infarct by noncontrast CT.       Ventricles and Cisterns: Ventricular size and configuration is within normal  limits. Basal cisterns are patent. No abnormal extra-axial fluid collection.       Senescent Changes: None.               Impression       No acute intracranial abnormality.            Rudolpho SevinMark Brinckman, M.D.    12/09/2021 7:29:00 PM           Left lower leg skin lesion: (measure 9 mm by 7 mm)          ASSESSMENT and PLAN    1. Essential hypertension  -     amLODIPine (NORVASC) 10 MG tablet; Take 1 tablet by mouth daily For blood pressure, Disp-90 tablet, R-1Normal  -     TSH with Reflex; Future  -     [phone] - Venipuncture    Blood pressure is somewhat elevated today and is noted at 160/100.  She reports she has been taking her amlodipine 5 mg as prescribed by the ED.  Patient reports noticing home systolic reading as elevated as 170.  She does have associated headache whenever she has elevated BP reading.  She reports no unilateral focal weakness and no chest pain or shortness of breath.  Advised patient on importance of low-sodium diet in addition to healthy diet that is low in fat, low in cholesterol and low in carbs.  We will increase amlodipine to 10 mg/day.  Advised patient to check her blood pressure once to twice daily and record down blood pressure logs that were provided  today in office.  Will have patient follow-up in office in 2 to 3 weeks.    Advised pt to go immediately to the ED with any loss or changes in fields of vision, chest pain or SOB, unilateral weakness, loss of speech, changes in speech, confusion, facial drooping, syncope, seizure or incontinence of bladder or bowel functions.    2. Dysuria  -     AMB POC URINALYSIS DIP STICK AUTO W/O MICRO; Future  -     Culture, Urine; Future  -     CBC with Auto Differential; Future    UA is negative today in office.  She reports that she has been drinking plenty of water close to a gallon per day.  We will send sample out for a culture. Reviewed and discussed with patient behavioral changes that can decrease the frequency of any urinary infection include but not limited to eliminating constipation, front to back wiping, frequent voiding to keep bladder volumes low, double voiding, avoid soaking in water (including baths, pools, hot tubs), use of cranberry products, and use of probiotics. Patient was advised to consuming a minimum of 64 oz of water daily and avoiding consumption of sugary beverages and other known bladder irritants such as caffeinated drinks, sodas, etc.    3. Bilateral carpal tunnel syndrome  -     diclofenac (VOLTAREN) 50 MG EC tablet; Take 1 tablet by mouth 2 times daily as needed for Pain With food. No ibuprofen, Motrin, naproxen, Aleve, Excedrin, BC or Goody powder., Disp-60 tablet, R-0Normal  -     [facility], [address]    Patient with reports of bilateral hand and finger numbness and tingling.  She does have some wrist discomfort.  She reports symptom presentation has been present for several months and progressively worsening.  She has tried meloxicam, OTC ibuprofen/Aleve with minimal relief.  She also recently completed a course of  oral prednisone but also reports no significant relief with treatment.  She has tried OTC braces with no relief.  We will give patient  a prescription for diclofenac orally twice daily as needed for pain and inflammation.  We have discussed gabapentin but due to current rehab program, she is not able to take gabapentin as it causes sedation. Reviewed risks and side effects of diclofenac including increased GI upset, increased risks for cardiac disease/MI, and increased risk of nephrotoxicity.  Patient was advised to take medication with food, stay hydrated, and to avoid oral OTC NSAID aside for Tylenol/acetaminophen such as ibuprofen, Motrin, naproxen, Aleve, Excedrin, BC or Goody powder.  Referral placed to Ortho for consideration.    4. Chronic pain of left knee   Patient was recently seen in the ED.  She has an MRI of her left knee scheduled currently for 3/20.  She recently completed a course of prednisone and did report moderate relief.  However, she is out of medication and has noticed increase in pain.  She has stopped meloxicam use due to concurrent use of prednisone and increased risk for GI bleed.  Now that she has completed prednisone, will have patient start diclofenac as above for dual benefit for bilateral carpal tunnel and for left knee pain.  Stop meloxicam.  Advised patient to continue to use supportive care such as ice/heat and bracing.    5. Skin lesion  -     External Referral to Dermatology    Patient reports a skin lesion has been present for several years but progressively worsening in size and color as well as pain.  Referral placed to advanced dermatology for consideration.  Patient also given phone number for specialist office and advised to contact for scheduling appointment.    6. Vitamin D deficiency  -     Vitamin D 25 Hydroxy; Future  -     [phone] - Venipuncture    7. Encounter for screening for HIV  -     HIV 1/2 Ag/Ab, 4TH Generation,W Rflx Confirm; Future    8. Need for hepatitis C screening test  -     Hepatitis C Antibody; Future  -     [phone] - Venipuncture    9. Screening for lipoid disorders  -     Lipid Panel;  Future  -     Comprehensive Metabolic Panel; Future  -     [phone] - Venipuncture    10. Screening for diabetes mellitus  -     [phone] - Venipuncture    Orders Placed This Encounter   Procedures    Culture, Urine     Standing Status:   Future     Number of Occurrences:   1     Standing Expiration Date:   12/15/2022     Order Specific Question:   Specify (ex-cath, midstream, cysto, etc)?     Answer:   Clean catch    Hepatitis C Antibody     Standing Status:   Future     Number of Occurrences:   1     Standing Expiration Date:   12/15/2022    Vitamin D 25 Hydroxy     Standing Status:   Future     Number of Occurrences:   1     Standing Expiration Date:   12/15/2022    Lipid Panel     Standing Status:   Future     Number of Occurrences:   1  Standing Expiration Date:   12/15/2022    Comprehensive Metabolic Panel     Standing Status:   Future     Number of Occurrences:   1     Standing Expiration Date:   12/15/2022    HIV 1/2 Ag/Ab, 4TH Generation,W Rflx Confirm     Standing Status:   Future     Number of Occurrences:   1     Standing Expiration Date:   12/15/2022    CBC with Auto Differential     Standing Status:   Future     Number of Occurrences:   1     Standing Expiration Date:   12/15/2022    TSH with Reflex     Standing Status:   Future     Number of Occurrences:   1     Standing Expiration Date:   12/15/2022    [facility] - [facility], [address]     Referral Priority:   Routine     Referral Type:   Eval and Treat     Referral Reason:   Specialty Services Required     Requested Specialty:   Orthopedic Surgery     Number of Visits Requested:   1    External Referral to Dermatology     Referral Priority:   Routine     Referral Type:   Eval and Treat     Referral Reason:   Specialty Services Required     Requested Specialty:   Dermatology     Number of Visits Requested:   1    [phone] - Venipuncture    AMB POC URINALYSIS DIP STICK AUTO W/O MICRO     Standing Status:   Future     Number of  Occurrences:   1     Standing Expiration Date:   12/15/2022         Elements of this note have been dictated using speech recognition software. As a result, errors of speech recognition may have occurred.      On this date 12/14/2021 I have spent 45 minutes reviewing previous notes, test results and face to face with the patient discussing the diagnosis and importance of compliance with the treatment plan as well as documenting on the day of the visit. Greater than 50% of this visit was spent counseling the patient about test results, prognosis, importance of compliance, education about disease process, benefits of medications, instructions for management of acute symptoms, and follow up plans.    Return in about 22 days (around 01/05/2022) for Blood pressure check at 1130 am.     Darla Lesches, APRN - CNP

## 2021-12-15 ENCOUNTER — Ambulatory Visit (HOSPITAL_BASED_OUTPATIENT_CLINIC_OR_DEPARTMENT_OTHER): Admission: RE | Admit: 2021-12-15 | Payer: Medicare HMO | Source: Home / Self Care | Admitting: Obstetrics & Gynecology

## 2021-12-15 DIAGNOSIS — Z01818 Encounter for other preprocedural examination: Secondary | ICD-10-CM

## 2021-12-15 HISTORY — DX: Spondylosis without myelopathy or radiculopathy, lumbar region: M47.816

## 2021-12-15 HISTORY — DX: Paresthesia of skin: R20.2

## 2021-12-15 HISTORY — DX: Essential (primary) hypertension: I10

## 2021-12-15 LAB — CBC WITH AUTO DIFFERENTIAL
Absolute Eos #: 0.1 10*3/uL (ref 0.0–0.8)
Absolute Immature Granulocyte: 0 10*3/uL (ref 0.0–0.5)
Absolute Lymph #: 3.5 10*3/uL (ref 0.5–4.6)
Absolute Mono #: 0.6 10*3/uL (ref 0.1–1.3)
Basophils Absolute: 0.1 10*3/uL (ref 0.0–0.2)
Basophils: 1 % (ref 0.0–2.0)
Eosinophils %: 1 % (ref 0.5–7.8)
Hematocrit: 39.6 % (ref 35.8–46.3)
Hemoglobin: 12 g/dL (ref 11.7–15.4)
Immature Granulocytes: 1 % (ref 0.0–5.0)
Lymphocytes: 47 % — ABNORMAL HIGH (ref 13–44)
MCH: 25.4 PG — ABNORMAL LOW (ref 26.1–32.9)
MCHC: 30.3 g/dL — ABNORMAL LOW (ref 31.4–35.0)
MCV: 83.7 FL (ref 82–102)
MPV: 10.8 FL (ref 9.4–12.3)
Monocytes: 8 % (ref 4.0–12.0)
Platelets: 320 10*3/uL (ref 150–450)
RBC: 4.73 M/uL (ref 4.05–5.2)
RDW: 21.7 % — ABNORMAL HIGH (ref 11.9–14.6)
Seg Neutrophils: 43 % (ref 43–78)
Segs Absolute: 3.2 10*3/uL (ref 1.7–8.2)
WBC: 7.5 10*3/uL (ref 4.3–11.1)
nRBC: 0 10*3/uL (ref 0.0–0.2)

## 2021-12-15 LAB — COMPREHENSIVE METABOLIC PANEL
ALT: 38 U/L (ref 12–65)
AST: 6 U/L — ABNORMAL LOW (ref 15–37)
Albumin/Globulin Ratio: 1.3 (ref 0.4–1.6)
Albumin: 3.9 g/dL (ref 3.5–5.0)
Alk Phosphatase: 83 U/L (ref 50–136)
Anion Gap: 5 mmol/L (ref 2–11)
BUN: 19 MG/DL (ref 6–23)
CO2: 29 mmol/L (ref 21–32)
Calcium: 9.8 MG/DL (ref 8.3–10.4)
Chloride: 107 mmol/L (ref 101–110)
Creatinine: 0.7 MG/DL (ref 0.6–1.0)
Est, Glom Filt Rate: >60 mL/min/{1.73_m2} (ref >60)
Globulin: 3 g/dL (ref 2.8–4.5)
Glucose: 118 mg/dL — ABNORMAL HIGH (ref 65–100)
Potassium: 4 mmol/L (ref 3.5–5.1)
Sodium: 141 mmol/L (ref 133–143)
Total Bilirubin: 0.2 MG/DL (ref 0.2–1.1)
Total Protein: 6.9 g/dL (ref 6.3–8.2)

## 2021-12-15 LAB — TSH WITH REFLEX: TSH w Free Thyroid if Abnormal: 1.1 u[IU]/mL (ref 0.358–3.740)

## 2021-12-15 LAB — LIPID PANEL
Chol/HDL Ratio: 4
Cholesterol, Total: 215 MG/DL — ABNORMAL HIGH (ref <200)
HDL: 54 MG/DL (ref 40–60)
LDL Calculated: 126.6 MG/DL — ABNORMAL HIGH (ref <100)
Triglycerides: 172 MG/DL — ABNORMAL HIGH (ref 35–150)
VLDL Cholesterol Calculated: 34.4 MG/DL — ABNORMAL HIGH (ref 6.0–23.0)

## 2021-12-15 LAB — HEPATITIS C ANTIBODY: Hepatitis C Ab: NONREACTIVE

## 2021-12-15 LAB — VITAMIN D 25 HYDROXY: Vit D, 25-Hydroxy: 33.1 ng/mL (ref 30.0–100.0)

## 2021-12-15 LAB — HIV 1/2 AG/AB, 4TH GENERATION,W RFLX CONFIRM: HIV 1/2 Interp: NONREACTIVE

## 2021-12-15 SURGERY — HYSTERECTOMY, VAGINAL
Anesthesia: Choice | Laterality: Bilateral

## 2021-12-17 LAB — CULTURE, URINE: Culture: 10000

## 2021-12-20 ENCOUNTER — Inpatient Hospital Stay: Admit: 2021-12-20 | Payer: MEDICARE | Primary: Family

## 2021-12-20 ENCOUNTER — Ambulatory Visit: Payer: MEDICARE

## 2021-12-20 DIAGNOSIS — M25562 Pain in left knee: Secondary | ICD-10-CM | POA: Diagnosis not present

## 2021-12-20 DIAGNOSIS — M25462 Effusion, left knee: Secondary | ICD-10-CM | POA: Diagnosis not present

## 2021-12-23 DIAGNOSIS — D2372 Other benign neoplasm of skin of left lower limb, including hip: Secondary | ICD-10-CM | POA: Diagnosis not present

## 2021-12-30 ENCOUNTER — Encounter: Admit: 2021-12-30 | Discharge: 2021-12-30 | Payer: MEDICARE | Attending: Hand Surgery | Primary: Family

## 2021-12-30 DIAGNOSIS — G5603 Carpal tunnel syndrome, bilateral upper limbs: Secondary | ICD-10-CM | POA: Diagnosis not present

## 2021-12-30 NOTE — Progress Notes (Signed)
Orthopaedic Hand Surgery Note    Name: Lisa Savage  Date of Birth: 11-18-1975  Gender: female  MRN: [id number]    CC: New patient referred for hand numbness      HPI: Patient is a 46 y.o. female with a chief complaint of bilateral hand numbness and tingling in the median nerve distribution. The symptoms have been going on for 2 months. The patient does complain of night wakening and increased symptoms with driving. Evaluation to date has included none. Treatment to date has included none.     ROS/Meds/PSH/PMH/FH/SH: I personally reviewed the patients standard intake form.  Pertinents are discussed In the HPI    Physical Examination:    Musculoskeletal:     Examination of the bilateral upper extremity demonstrates Decreased sensation to light touch in the median distribution, normal sensation in ulnar and radial distribution, Positive carpal tunnel compression testing and Phalen testing, cap refill < 5 seconds in all fingers. Inspection reveals no thenar atrophy. Negative Tinel and elbow flexion compression test of the cubital tunnel, negative Tinel over Guyon's canal. Sensation to light touch in the ulnar 2 digits is normal with no intrinsic atrophy/weakness. No tenderness to palpation or masses noted in the forearm.    Imaging / Electrodiagnostic Tests:     none    Assessment:     ICD-10-CM    1. Carpal tunnel syndrome, bilateral  G56.03 Ambulatory Referral to DME          Plan:  We discussed the diagnosis and different treatment options. We discussed observation, EMG/NCV, night splinting, cortisone injections and surgical decompression and the risks and benefits of all were clearly outlined. After discussing in detail the patient elects to proceed with use of wrist braces at nighttime, which we will provide today.  Also refer for nerve conduction study.  Once this is complete she will return we will discuss the results and further treatment options.     Patient voiced accordance and understanding of the  information provided and the formulated plan. All questions were answered to the patient's satisfaction during the encounter.        Lenard Galloway, MD  Orthopaedic Surgery  12/30/21  10:30 AM

## 2021-12-30 NOTE — Progress Notes (Signed)
Patient was fit for a Wrist/Forearm lacer for patients bilateral elbow pain. Patient is instructed the brace should fit nicely with in the palm and right below the knuckles on the dorsal side of the hand. The patient was aware the brace should fit snuggly around the wrist/forearm area. The strap placed through the thumb and first finger should fit comfortably to insure the brace does not slide or shift.  Patient read and signed documenting they understand and agree to [facility]'s current DME return policy.

## 2022-01-04 IMAGING — DX DG CHEST 1V PORT
1 series · 1 of 1 positions shown · non-contrast
Comparison: None.

CLINICAL DATA: Chest pain.  Shortness of breath.

EXAM:
PORTABLE CHEST 1 VIEW

[chest ap]
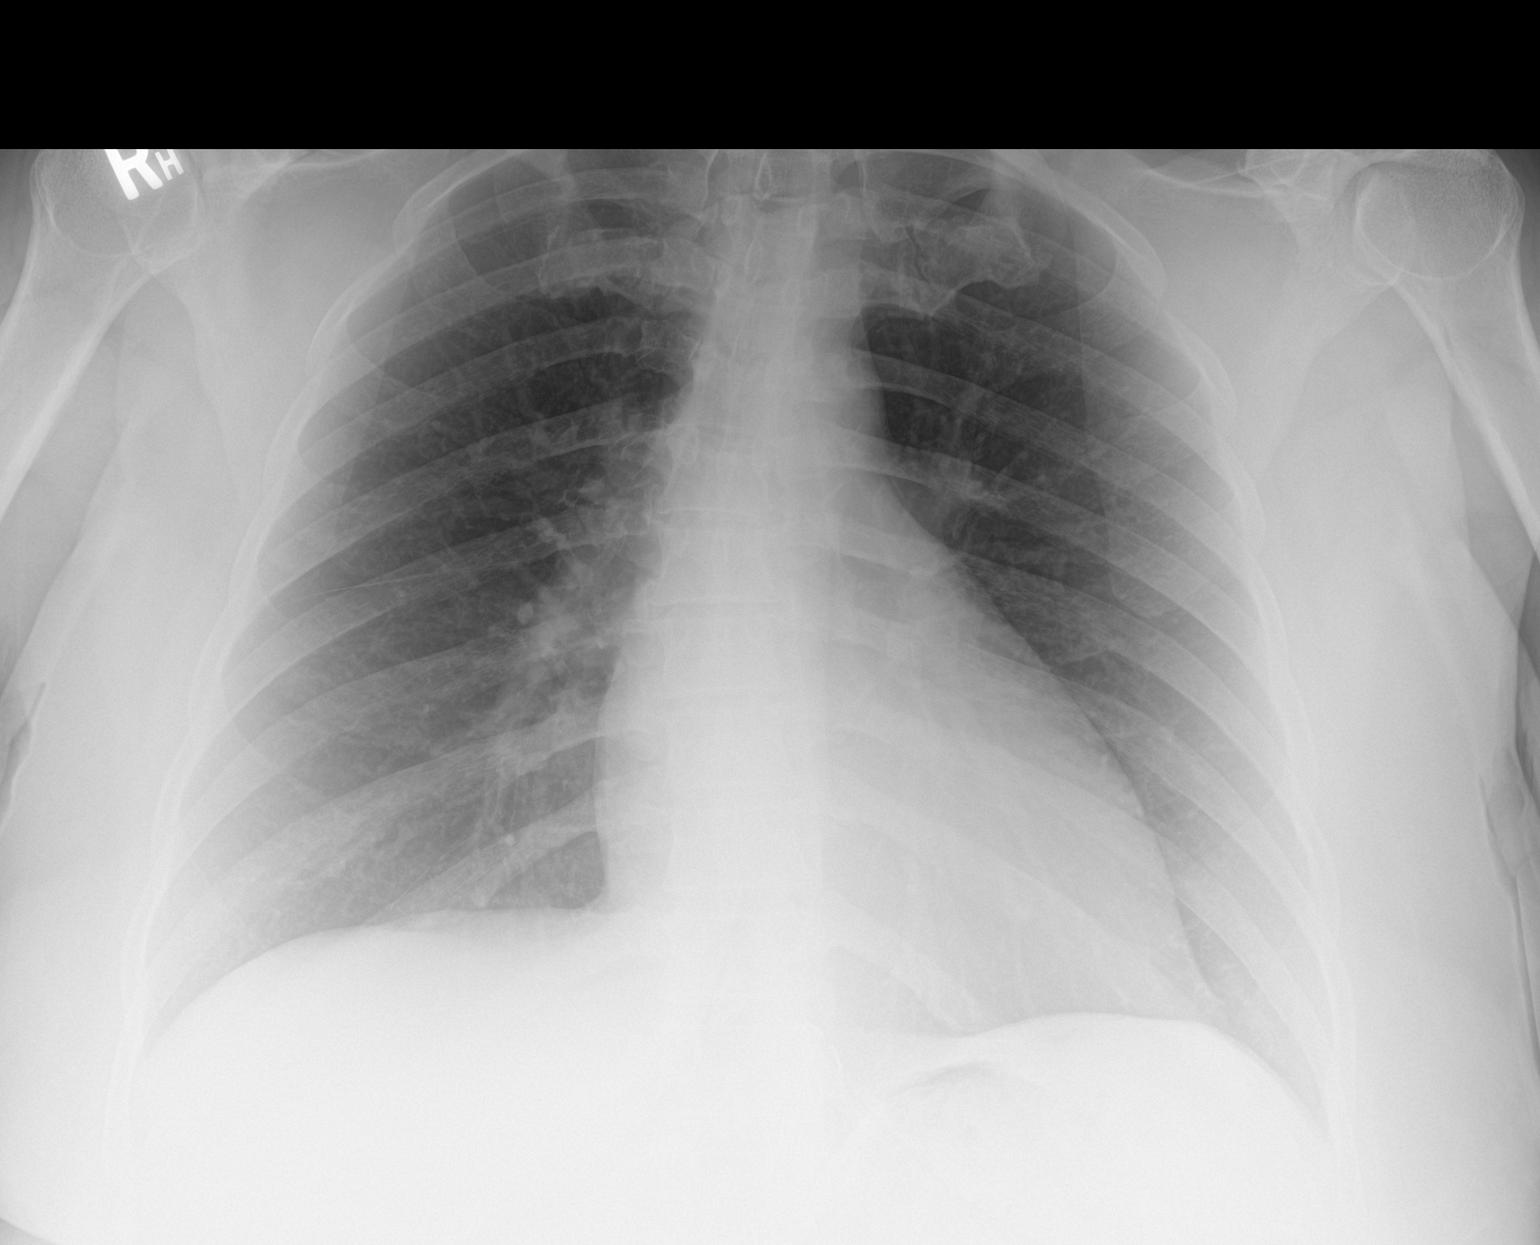

[1 of 1 positions shown; findings below may reference images not displayed]

FINDINGS: The heart size and mediastinal contours are within normal limits.
Both lungs are clear. The visualized skeletal structures are
unremarkable.
IMPRESSION: No active disease.

## 2022-01-05 ENCOUNTER — Encounter: Payer: MEDICARE | Attending: Family | Primary: Family

## 2022-01-10 ENCOUNTER — Telehealth

## 2022-01-10 MED ORDER — DICLOFENAC SODIUM 50 MG PO TBEC
50 MG | ORAL_TABLET | Freq: Two times a day (BID) | ORAL | 2 refills | Status: AC | PRN
Start: 2022-01-10 — End: ?

## 2022-01-10 NOTE — Telephone Encounter (Signed)
RX sent.

## 2022-01-10 NOTE — Telephone Encounter (Signed)
-----   Message from Lacie Scotts sent at 01/07/2022  9:09 AM EDT -----  Subject: Refill Request    QUESTIONS  Name of Medication? diclofenac (VOLTAREN) 50 MG EC tablet  Patient-reported dosage and instructions? 1 tablet twice daily   How many days do you have left? 3  Preferred Pharmacy? [facility] [phone]  Pharmacy phone number (if available)? [phone]  Additional Information for Provider? Patient is at [facility] [employer] and the contact number is for them, they will get the patient   when you call for her.   ---------------------------------------------------------------------------  --------------  CALL BACK INFO  What is the best way for the office to contact you? OK to leave message on   voicemail  Preferred Call Back Phone Number? [phone]  ---------------------------------------------------------------------------  --------------  SCRIPT ANSWERS  Relationship to Patient? Self

## 2022-01-10 NOTE — Telephone Encounter (Signed)
Pt scheduled for June for a follow up appt. Would like to see if there is any appointments sooner than June. She's experiencing swelling in lower extremities with fluctuating BP.

## 2022-01-14 ENCOUNTER — Ambulatory Visit (HOSPITAL_COMMUNITY)
Admission: EM | Admit: 2022-01-14 | Discharge: 2022-01-14 | Disposition: A | Discharge status: Home / Self Care | Payer: Medicare HMO | Attending: Internal Medicine | Admitting: Internal Medicine

## 2022-01-14 ENCOUNTER — Encounter (HOSPITAL_COMMUNITY): Payer: Self-pay

## 2022-01-14 DIAGNOSIS — R600 Localized edema: Secondary | ICD-10-CM

## 2022-01-14 LAB — COMPREHENSIVE METABOLIC PANEL
ALT: 27 U/L (ref 0–44)
AST: 20 U/L (ref 15–41)
Albumin: 3.8 g/dL (ref 3.5–5.0)
Alkaline Phosphatase: 66 U/L (ref 38–126)
Anion gap: 5 (ref 5–15)
BUN: 6 mg/dL (ref 6–20)
CO2: 26 mmol/L (ref 22–32)
Calcium: 9.7 mg/dL (ref 8.9–10.3)
Chloride: 107 mmol/L (ref 98–111)
Creatinine, Ser: 0.54 mg/dL (ref 0.44–1.00)
GFR, Estimated: >60 mL/min (ref >60)
Glucose, Bld: 91 mg/dL (ref 70–99)
Potassium: 4.3 mmol/L (ref 3.5–5.1)
Sodium: 138 mmol/L (ref 135–145)
Total Bilirubin: 0.4 mg/dL (ref 0.3–1.2)
Total Protein: 6.5 g/dL (ref 6.5–8.1)

## 2022-01-14 MED ORDER — HYDROCHLOROTHIAZIDE 25 MG PO TABS: 25.0000 mg | ORAL_TABLET | Freq: Every day | ORAL | 1 refills | Status: AC

## 2022-01-14 MED ORDER — GABAPENTIN 100 MG PO CAPS: 100.0000 mg | ORAL_CAPSULE | Freq: Two times a day (BID) | ORAL | 0 refills | Status: AC

## 2022-01-14 NOTE — ED Provider Notes (Signed)
?[facility] ? ? ? ?CSN: [id number] ?Arrival date & time: 01/14/22  1536 ? ? ?  ? ?History   ?Chief Complaint ?Chief Complaint  ?Patient presents with  ? Leg Swelling  ? Hand Problem  ? ? ?HPI ?Sydney Cobb is a 46 y.o. female comes to the urgent care with bilateral lower extremity swelling of 3 to 4 weeks duration.  Patient endorses swelling of the lower extremities after being started on amlodipine for blood pressure control.  She endorses some burning sensation on her legs as well associated with the swelling.  The burning sensation is severe and has been interrupting her sleep pattern.  She denies any shortness of breath, orthopnea or proximal nocturnal dyspnea.  No chest pain or chest pressure.  No palpitations.  Patient also experiences some swelling in her upper extremities.  No facial swelling.  No frothiness of urine.  Blood pressure is well controlled.  No headaches no abdominal pain.  Patient denies history of congestive heart failure. ? ?HPI ? ?Past Medical History:  ?Diagnosis Date  ? Anemia   ? Chest pain 07/15/2021  ? Patient came to ED for AUB, fatigue, chest tightness, SOB, and abd discomfort. 07/15/21 EKG = sinus rhythm, normal Cxray, normal troponin levels and HGB 10.4. As of 11/02/21 phone call with patient, she has not had any chest pain or SOB since then.  ? COVID-19 10/12/2019  ? chills,sore throat, loss of smell  ? Depression   ? Hypertension   ? Per 11/03/21 phone call with patient, sometimes her BP runs high but she has never been put on medication.  ? Lumbar stenosis with neurogenic claudication 2021  ? Patient rec'd steroid injections.  ? Migraine 03/13/2020  ? Paresthesia of both hands   ? Spondylosis of lumbar region without myelopathy or radiculopathy   ? lumbar  ? Syphilis 04/2021  ? patient rec'd PCN injections  ? ? ?Patient Active Problem List  ? Diagnosis Date Noted  ? Adenomyosis 07/28/2021  ? Intramural leiomyoma of uterus 07/28/2021  ? Abnormal uterine bleeding (AUB)  06/03/2021  ? Paresthesia of both hands 06/03/2021  ? Vaginal spotting 05/16/2021  ? Excessive bleeding in premenopausal period 03/31/2021  ? Lumbar degenerative disc disease 08/03/2020  ? Body mass index (BMI) 40.0-44.9, adult ([place]) 07/01/2020  ? Elevated blood-pressure reading, without diagnosis of hypertension 07/01/2020  ? Lumbar stenosis without neurogenic claudication 07/01/2020  ? Microcytic anemia 05/28/2020  ? NSAID long-term use 05/28/2020  ? Chronic right shoulder pain 09/30/2019  ? Left knee pain 09/30/2019  ? Back pain 09/30/2019  ? History of depression 09/30/2019  ? ? ?Past Surgical History:  ?Procedure Laterality Date  ? COLONOSCOPY  11/02/2021  ? HAND SURGERY Right 2015  ? reattached small finger  ? REDUCTION MAMMAPLASTY Bilateral 2014  ? TUBAL LIGATION  2004  ? ? ?OB History   ? ? Gravida  ?4  ? Para  ?4  ? Term  ?4  ? Preterm  ?0  ? AB  ?0  ? Living  ?4  ?  ? ? SAB  ?   ? IAB  ?   ? Ectopic  ?   ? Multiple  ?   ? Live Births  ?4  ?   ?  ? Obstetric Comments  ?4 Vaginal deliveries  ?  ? ?  ? ? ? ?Home Medications   ? ?Prior to Admission medications   ?Medication Sig Start Date End Date Taking? Authorizing Provider  ?gabapentin (NEURONTIN)  100 MG capsule Take 1 capsule (100 mg total) by mouth 2 (two) times daily. 01/14/22  Yes Tamiko Leopard, Myrene Galas, MD  ?hydrochlorothiazide (HYDRODIURIL) 25 MG tablet Take 1 tablet (25 mg total) by mouth daily. 01/14/22  Yes Mignon Bechler, Myrene Galas, MD  ?Ascorbic Acid (VITAMIN C PO) Take 1 tablet by mouth daily.    [provider]  ?estradiol (ESTRACE) 1 MG tablet Take 1 tablet (1 mg total) by mouth daily. 09/14/21   Woodroe Mode, MD  ?Ferrous Sulfate (IRON PO) Take 1 tablet by mouth daily.    [provider]  ?ibuprofen (ADVIL) 600 MG tablet Take 1 tablet (600 mg total) by mouth every 8 (eight) hours as needed. ?Patient taking differently: Take 600 mg by mouth every 8 (eight) hours as needed for fever, headache or mild pain. 06/01/21   Carollee Leitz, MD   ?megestrol (MEGACE) 40 MG tablet Take 1 tablet (40 mg total) by mouth 2 (two) times daily. ?Patient not taking: Reported on 10/19/2021 07/28/21   Woodroe Mode, MD  ?ondansetron (ZOFRAN) 4 MG tablet Take 1 tablet (4 mg total) by mouth every 8 (eight) hours as needed for nausea or vomiting. 07/15/21   Tegeler, Gwenyth Allegra, MD  ?oxyCODONE-acetaminophen (PERCOCET) 10-325 MG tablet Take 1 tablet by mouth 3 (three) times daily as needed for pain. 06/24/21   [provider]  ?VITAMIN D PO Take 1 tablet by mouth daily.    [provider]  ?pantoprazole (PROTONIX) 40 MG tablet Take 1 tablet (40 mg total) by mouth daily. 05/28/20 10/13/20  Meccariello, Bernita Raisin, DO  ?potassium chloride (KLOR-CON) 10 MEQ tablet Take 1 tablet (10 mEq total) by mouth daily. 03/13/20 10/13/20  Garald Balding, PA-C  ? ? ?Family History ?Family History  ?Problem Relation Age of Onset  ? Breast cancer Mother 28  ?     deceased at 23  ? Hypertension Father   ? Parkinson's disease Father   ? Colon cancer Neg Hx   ? Colon polyps Neg Hx   ? Esophageal cancer Neg Hx   ? Rectal cancer Neg Hx   ? Stomach cancer Neg Hx   ? ? ?Social History ?Social History  ? ?Tobacco Use  ? Smoking status: Former  ?  Packs/day: 1.50  ?  Years: 25.00  ?  Pack years: 37.50  ?  Types: Cigarettes  ?  Quit date: 2020  ?  Years since quitting: 3.2  ? Smokeless tobacco: Never  ?Vaping Use  ? Vaping Use: Some days  ? Substances: Flavoring  ?Substance Use Topics  ? Alcohol use: Not Currently  ? Drug use: Never  ? ? ? ?Allergies   ?Patient has no known allergies. ? ? ?Review of Systems ?Review of Systems ?As per HPI ? ?Physical Exam ?Triage Vital Signs ?ED Triage Vitals  ?Enc Vitals Group  ?   BP 01/14/22 1629 125/77  ?   Pulse Rate 01/14/22 1629 87  ?   Resp 01/14/22 1629 16  ?   Temp 01/14/22 1629 98.2 ?F (36.8 ?C)  ?   Temp Source 01/14/22 1629 Oral  ?   SpO2 01/14/22 1629 100 %  ?   Weight --   ?   Height --   ?   Head Circumference --   ?   Peak Flow --   ?    Pain Score 01/14/22 1705 0  ?   Pain Loc --   ?   Pain Edu? --   ?  Excl. in [place]? --   ? ?No data found. ? ?Updated Vital Signs ?BP 125/77 (BP Location: Left Arm)   Pulse 87   Temp 98.2 ?F (36.8 ?C) (Oral)   Resp 16   SpO2 100%  ? ?Visual Acuity ?Right Eye Distance:   ?Left Eye Distance:   ?Bilateral Distance:   ? ?Right Eye Near:   ?Left Eye Near:    ?Bilateral Near:    ? ?Physical Exam ?Vitals and nursing note reviewed.  ?Constitutional:   ?   General: She is not in acute distress. ?   Appearance: She is not ill-appearing.  ?Cardiovascular:  ?   Rate and Rhythm: Normal rate and regular rhythm.  ?   Pulses: Normal pulses.  ?   Heart sounds: Normal heart sounds.  ?Pulmonary:  ?   Effort: Pulmonary effort is normal.  ?   Breath sounds: Normal breath sounds. No wheezing or rhonchi.  ?Musculoskeletal:     ?   General: Normal range of motion.  ?   Comments: Bilateral lower extremity edema-nonpitting.  No erythema  ?Neurological:  ?   Mental Status: She is alert.  ? ? ? ?UC Treatments / Results  ?Labs ?(all labs ordered are listed, but only abnormal results are displayed) ?Labs Reviewed  ?COMPREHENSIVE METABOLIC PANEL  ? ? ?EKG ? ? ?Radiology ?No results found. ? ?Procedures ?Procedures (including critical care time) ? ?Medications Ordered in UC ?Medications - No data to display ? ?Initial Impression / Assessment and Plan / UC Course  ?I have reviewed the triage vital signs and the nursing notes. ? ?Pertinent labs & imaging results that were available during my care of the patient were reviewed by me and considered in my medical decision making (see chart for details). ? ?  ? ?1.  Bilateral lower extremity edema: ?Discontinue amlodipine ?Start hydrochlorothiazide 25 mg orally daily ?Gabapentin 100 mg twice daily as needed for neuropathic pain ?Daily weights ?Decrease salt intake to 2 g or less a day ?Comprehensive metabolic panel to evaluate renal function and albumin level ?We will call you with recommendations if  labs are abnormal. ?Final Clinical Impressions(s) / UC Diagnoses  ? ?Final diagnoses:  ?Bilateral lower extremity edema  ? ? ? ?Discharge Instructions   ? ?  ?Please take medications as prescribed ?Discontinu

## 2022-01-14 NOTE — Discharge Instructions (Signed)
Please take medications as prescribed ?Discontinue amlodipine because of leg swelling ?I started you on hydrochlorothiazide ?Decrease salt intake to less than 2 g in 24 hours. ?We will call you with recommendations if labs are abnormal ?Turn to urgent care if you have worsening symptoms i.e. legswelling shortness of breath and persistently elevated blood pressures. ?

## 2022-01-14 NOTE — ED Triage Notes (Signed)
C/o swelling in legs,hands and feet x 1 month. She was started on Norvasc 10 mg about 1 month ago since then she started with swelling.  ?

## 2022-01-17 DIAGNOSIS — M47816 Spondylosis without myelopathy or radiculopathy, lumbar region: Secondary | ICD-10-CM | POA: Diagnosis not present

## 2022-01-17 DIAGNOSIS — Z79899 Other long term (current) drug therapy: Secondary | ICD-10-CM | POA: Diagnosis not present

## 2022-01-17 DIAGNOSIS — R030 Elevated blood-pressure reading, without diagnosis of hypertension: Secondary | ICD-10-CM | POA: Diagnosis not present

## 2022-01-17 DIAGNOSIS — M539 Dorsopathy, unspecified: Secondary | ICD-10-CM | POA: Diagnosis not present

## 2022-01-17 DIAGNOSIS — E559 Vitamin D deficiency, unspecified: Secondary | ICD-10-CM | POA: Diagnosis not present

## 2022-01-17 DIAGNOSIS — N926 Irregular menstruation, unspecified: Secondary | ICD-10-CM | POA: Diagnosis not present

## 2022-01-17 DIAGNOSIS — Z6837 Body mass index (BMI) 37.0-37.9, adult: Secondary | ICD-10-CM | POA: Diagnosis not present

## 2022-01-19 ENCOUNTER — Encounter: Payer: MEDICARE | Attending: Family | Primary: Family

## 2022-01-19 DIAGNOSIS — Z79899 Other long term (current) drug therapy: Secondary | ICD-10-CM | POA: Diagnosis not present

## 2022-01-25 DIAGNOSIS — M47816 Spondylosis without myelopathy or radiculopathy, lumbar region: Secondary | ICD-10-CM | POA: Diagnosis not present

## 2022-01-25 DIAGNOSIS — R202 Paresthesia of skin: Secondary | ICD-10-CM | POA: Diagnosis not present

## 2022-01-25 DIAGNOSIS — Z6836 Body mass index (BMI) 36.0-36.9, adult: Secondary | ICD-10-CM | POA: Diagnosis not present

## 2022-01-25 DIAGNOSIS — M539 Dorsopathy, unspecified: Secondary | ICD-10-CM | POA: Diagnosis not present

## 2022-02-15 DIAGNOSIS — Z6837 Body mass index (BMI) 37.0-37.9, adult: Secondary | ICD-10-CM | POA: Diagnosis not present

## 2022-02-15 DIAGNOSIS — M539 Dorsopathy, unspecified: Secondary | ICD-10-CM | POA: Diagnosis not present

## 2022-02-15 DIAGNOSIS — Z6836 Body mass index (BMI) 36.0-36.9, adult: Secondary | ICD-10-CM | POA: Diagnosis not present

## 2022-02-15 DIAGNOSIS — Z79899 Other long term (current) drug therapy: Secondary | ICD-10-CM | POA: Diagnosis not present

## 2022-02-15 DIAGNOSIS — N926 Irregular menstruation, unspecified: Secondary | ICD-10-CM | POA: Diagnosis not present

## 2022-02-15 DIAGNOSIS — M47816 Spondylosis without myelopathy or radiculopathy, lumbar region: Secondary | ICD-10-CM | POA: Diagnosis not present

## 2022-02-21 ENCOUNTER — Encounter: Payer: MEDICARE | Attending: Physical Medicine & Rehabilitation | Primary: Family

## 2022-02-22 ENCOUNTER — Other Ambulatory Visit: Payer: Medicare HMO | Admitting: Obstetrics

## 2022-02-24 ENCOUNTER — Encounter: Payer: MEDICARE | Attending: Hand Surgery | Primary: Family

## 2022-03-10 ENCOUNTER — Encounter: Payer: MEDICARE | Attending: Family | Primary: Family

## 2022-03-15 DIAGNOSIS — R030 Elevated blood-pressure reading, without diagnosis of hypertension: Secondary | ICD-10-CM | POA: Diagnosis not present

## 2022-03-15 DIAGNOSIS — N926 Irregular menstruation, unspecified: Secondary | ICD-10-CM | POA: Diagnosis not present

## 2022-03-15 DIAGNOSIS — M47816 Spondylosis without myelopathy or radiculopathy, lumbar region: Secondary | ICD-10-CM | POA: Diagnosis not present

## 2022-03-15 DIAGNOSIS — M539 Dorsopathy, unspecified: Secondary | ICD-10-CM | POA: Diagnosis not present

## 2022-03-15 DIAGNOSIS — R202 Paresthesia of skin: Secondary | ICD-10-CM | POA: Diagnosis not present

## 2022-03-15 DIAGNOSIS — Z6837 Body mass index (BMI) 37.0-37.9, adult: Secondary | ICD-10-CM | POA: Diagnosis not present

## 2022-03-15 DIAGNOSIS — Z79899 Other long term (current) drug therapy: Secondary | ICD-10-CM | POA: Diagnosis not present

## 2022-03-18 DIAGNOSIS — Z79899 Other long term (current) drug therapy: Secondary | ICD-10-CM | POA: Diagnosis not present

## 2022-04-01 ENCOUNTER — Other Ambulatory Visit: Payer: Medicare HMO | Admitting: Obstetrics

## 2022-04-18 DIAGNOSIS — M47816 Spondylosis without myelopathy or radiculopathy, lumbar region: Secondary | ICD-10-CM | POA: Diagnosis not present

## 2022-04-18 DIAGNOSIS — M539 Dorsopathy, unspecified: Secondary | ICD-10-CM | POA: Diagnosis not present

## 2022-04-18 DIAGNOSIS — R030 Elevated blood-pressure reading, without diagnosis of hypertension: Secondary | ICD-10-CM | POA: Diagnosis not present

## 2022-04-18 DIAGNOSIS — Z6837 Body mass index (BMI) 37.0-37.9, adult: Secondary | ICD-10-CM | POA: Diagnosis not present

## 2022-04-18 DIAGNOSIS — Z6833 Body mass index (BMI) 33.0-33.9, adult: Secondary | ICD-10-CM | POA: Diagnosis not present

## 2022-04-18 DIAGNOSIS — R202 Paresthesia of skin: Secondary | ICD-10-CM | POA: Diagnosis not present

## 2022-04-18 DIAGNOSIS — Z6836 Body mass index (BMI) 36.0-36.9, adult: Secondary | ICD-10-CM | POA: Diagnosis not present

## 2022-04-18 DIAGNOSIS — N926 Irregular menstruation, unspecified: Secondary | ICD-10-CM | POA: Diagnosis not present

## 2022-04-18 DIAGNOSIS — Z79899 Other long term (current) drug therapy: Secondary | ICD-10-CM | POA: Diagnosis not present

## 2022-04-20 DIAGNOSIS — Z79899 Other long term (current) drug therapy: Secondary | ICD-10-CM | POA: Diagnosis not present

## 2022-05-06 ENCOUNTER — Other Ambulatory Visit: Payer: Medicare HMO | Admitting: Obstetrics & Gynecology

## 2022-05-31 DIAGNOSIS — M539 Dorsopathy, unspecified: Secondary | ICD-10-CM | POA: Diagnosis not present

## 2022-05-31 DIAGNOSIS — R202 Paresthesia of skin: Secondary | ICD-10-CM | POA: Diagnosis not present

## 2022-05-31 DIAGNOSIS — Z79899 Other long term (current) drug therapy: Secondary | ICD-10-CM | POA: Diagnosis not present

## 2022-05-31 DIAGNOSIS — M47816 Spondylosis without myelopathy or radiculopathy, lumbar region: Secondary | ICD-10-CM | POA: Diagnosis not present

## 2022-05-31 DIAGNOSIS — Z6833 Body mass index (BMI) 33.0-33.9, adult: Secondary | ICD-10-CM | POA: Diagnosis not present

## 2022-05-31 DIAGNOSIS — R030 Elevated blood-pressure reading, without diagnosis of hypertension: Secondary | ICD-10-CM | POA: Diagnosis not present

## 2022-05-31 DIAGNOSIS — N926 Irregular menstruation, unspecified: Secondary | ICD-10-CM | POA: Diagnosis not present

## 2022-06-03 DIAGNOSIS — Z79899 Other long term (current) drug therapy: Secondary | ICD-10-CM | POA: Diagnosis not present

## 2024-11-03 DEATH — deceased
